# Patient Record
Sex: Female | Born: 1965 | Race: White | Hispanic: No | Marital: Married | Smoking: Never smoker
Health system: Southern US, Community
[De-identification: ages and names within clinical notes are randomized; demographics above are authoritative.]

## PROBLEM LIST (undated history)

## (undated) DIAGNOSIS — K219 Gastro-esophageal reflux disease without esophagitis: Secondary | ICD-10-CM

## (undated) DIAGNOSIS — R1310 Dysphagia, unspecified: Secondary | ICD-10-CM

## (undated) DIAGNOSIS — B019 Varicella without complication: Secondary | ICD-10-CM

## (undated) DIAGNOSIS — K589 Irritable bowel syndrome without diarrhea: Secondary | ICD-10-CM

## (undated) DIAGNOSIS — Z8719 Personal history of other diseases of the digestive system: Secondary | ICD-10-CM

## (undated) DIAGNOSIS — K635 Polyp of colon: Secondary | ICD-10-CM

## (undated) DIAGNOSIS — N809 Endometriosis, unspecified: Secondary | ICD-10-CM

## (undated) DIAGNOSIS — G43909 Migraine, unspecified, not intractable, without status migrainosus: Secondary | ICD-10-CM

## (undated) DIAGNOSIS — Z8711 Personal history of peptic ulcer disease: Secondary | ICD-10-CM

## (undated) DIAGNOSIS — Z87442 Personal history of urinary calculi: Secondary | ICD-10-CM

## (undated) DIAGNOSIS — F32A Depression, unspecified: Secondary | ICD-10-CM

## (undated) DIAGNOSIS — N281 Cyst of kidney, acquired: Secondary | ICD-10-CM

## (undated) DIAGNOSIS — F329 Major depressive disorder, single episode, unspecified: Secondary | ICD-10-CM

## (undated) HISTORY — DX: Personal history of urinary calculi: Z87.442

## (undated) HISTORY — DX: Endometriosis, unspecified: N80.9

## (undated) HISTORY — DX: Polyp of colon: K63.5

## (undated) HISTORY — DX: Varicella without complication: B01.9

## (undated) HISTORY — DX: Migraine, unspecified, not intractable, without status migrainosus: G43.909

## (undated) HISTORY — DX: Personal history of other diseases of the digestive system: Z87.19

## (undated) HISTORY — DX: Depression, unspecified: F32.A

## (undated) HISTORY — DX: Irritable bowel syndrome, unspecified: K58.9

## (undated) HISTORY — DX: Major depressive disorder, single episode, unspecified: F32.9

## (undated) HISTORY — DX: Dysphagia, unspecified: R13.10

---

## 1991-08-31 HISTORY — PX: LITHOTRIPSY: SUR834

## 1999-08-31 HISTORY — PX: ABDOMINAL HYSTERECTOMY: SHX81

## 2002-08-30 HISTORY — PX: BUNIONECTOMY: SHX129

## 2003-08-31 HISTORY — PX: BREAST SURGERY: SHX581

## 2013-08-30 DIAGNOSIS — K635 Polyp of colon: Secondary | ICD-10-CM

## 2013-08-30 HISTORY — DX: Polyp of colon: K63.5

## 2015-01-17 ENCOUNTER — Emergency Department
Admission: EM | Admit: 2015-01-17 | Discharge: 2015-01-17 | Disposition: A | Discharge status: Home / Self Care | Payer: BLUE CROSS/BLUE SHIELD | Attending: Emergency Medicine | Admitting: Emergency Medicine

## 2015-01-17 ENCOUNTER — Encounter: Payer: Self-pay | Admitting: Emergency Medicine

## 2015-01-17 ENCOUNTER — Other Ambulatory Visit: Payer: Self-pay

## 2015-01-17 DIAGNOSIS — R55 Syncope and collapse: Secondary | ICD-10-CM | POA: Diagnosis not present

## 2015-01-17 DIAGNOSIS — R109 Unspecified abdominal pain: Secondary | ICD-10-CM | POA: Insufficient documentation

## 2015-01-17 DIAGNOSIS — R1030 Lower abdominal pain, unspecified: Secondary | ICD-10-CM

## 2015-01-17 LAB — HEPATIC FUNCTION PANEL
ALT: 13 U/L — ABNORMAL LOW (ref 14–54)
AST: 18 U/L (ref 15–41)
Albumin: 4.4 g/dL (ref 3.5–5.0)
Alkaline Phosphatase: 79 U/L (ref 38–126)
Bilirubin, Direct: <0.1 mg/dL — ABNORMAL LOW (ref 0.1–0.5)
Total Bilirubin: 0.4 mg/dL (ref 0.3–1.2)
Total Protein: 7.9 g/dL (ref 6.5–8.1)

## 2015-01-17 LAB — URINALYSIS COMPLETE WITH MICROSCOPIC (ARMC ONLY)
Bacteria, UA: NONE SEEN
Bilirubin Urine: NEGATIVE
GLUCOSE, UA: NEGATIVE mg/dL
Ketones, ur: NEGATIVE mg/dL
Leukocytes, UA: NEGATIVE
NITRITE: NEGATIVE
Protein, ur: NEGATIVE mg/dL
Specific Gravity, Urine: 1.019 (ref 1.005–1.030)
pH: 6 (ref 5.0–8.0)

## 2015-01-17 LAB — BASIC METABOLIC PANEL
ANION GAP: 7 (ref 5–15)
BUN: 17 mg/dL (ref 6–20)
CO2: 28 mmol/L (ref 22–32)
Calcium: 9.6 mg/dL (ref 8.9–10.3)
Chloride: 104 mmol/L (ref 101–111)
Creatinine, Ser: 0.98 mg/dL (ref 0.44–1.00)
GFR calc Af Amer: >60 mL/min (ref >60)
GFR calc non Af Amer: >60 mL/min (ref >60)
Glucose, Bld: 92 mg/dL (ref 65–99)
POTASSIUM: 3.8 mmol/L (ref 3.5–5.1)
SODIUM: 139 mmol/L (ref 135–145)

## 2015-01-17 LAB — CBC
HEMATOCRIT: 44.8 % (ref 35.0–47.0)
Hemoglobin: 14.7 g/dL (ref 12.0–16.0)
MCH: 28.6 pg (ref 26.0–34.0)
MCHC: 32.9 g/dL (ref 32.0–36.0)
MCV: 86.9 fL (ref 80.0–100.0)
PLATELETS: 229 10*3/uL (ref 150–440)
RBC: 5.16 MIL/uL (ref 3.80–5.20)
RDW: 13.3 % (ref 11.5–14.5)
WBC: 8.4 10*3/uL (ref 3.6–11.0)

## 2015-01-17 LAB — LIPASE, BLOOD: LIPASE: 57 U/L — AB (ref 22–51)

## 2015-01-17 LAB — TROPONIN I: Troponin I: <0.03 ng/mL (ref <0.031)

## 2015-01-17 NOTE — ED Notes (Signed)
Pt from home c/o LLQ pain with some nausea and vomiting, diarrhea. States no emesis in last 24 hours. Pt alert & oriented with warm, dry skin. Dr. Reita Cliche in with pt at time of assessment and preparing pt for discharge.

## 2015-01-17 NOTE — ED Provider Notes (Signed)
[facility] Emergency Department Provider Note   ____________________________________________  Time seen: 1:30 PM I have reviewed the triage vital signs and the triage nursing note.  HISTORY  Chief Complaint Abdominal Pain and Loss of Consciousness   Historian Patient and her boyfriend  HPI Suzanne Cruz is a 49 y.o. female who came in for emergency department evaluation after passing out. She was having some abdominal cramping located diffusely and similar to prior "stomach" problems and started to feel lightheaded and dizzy and then passed out. She has no traumatic injury. She's had stomach ulcers and diverticulosis in the past. She is new to the area and does not have primary care or gastroenterologist here. She's not had a fever. She denies any chest pain, palpitations, short of breath, weakness, numbness.    Past Medical History  Diagnosis Date  . Esophagitis    stomach ulcers, diverticulitis  There are no active problems to display for this patient.   History reviewed. No pertinent past surgical history.  No current outpatient prescriptions on file.  Allergies Sulfa antibiotics  History reviewed. No pertinent family history.  Social History History  Substance Use Topics  . Smoking status: Never Smoker   . Smokeless tobacco: Never Used  . Alcohol Use: No   patient recently moved here from [facility]  Review of Systems  Constitutional: Negative for fever. Eyes: Negative for visual changes. ENT: Negative for sore throat. Cardiovascular: Negative for chest pain. Respiratory: Negative for shortness of breath. Gastrointestinal: Chronic abdominal pain in the epigastrium through to the back and the upper area. Diarrhea once last week and then resolved Genitourinary: Negative for dysuria. Musculoskeletal: Negative for back pain. Skin: Negative for rash. Neurological: Negative for headaches, focal weakness or  numbness.  ____________________________________________   PHYSICAL EXAM:  VITAL SIGNS: ED Triage Vitals  Enc Vitals Group     BP 01/17/15 1133 73/54 mmHg     Pulse Rate 01/17/15 1133 74     Resp 01/17/15 1133 20     Temp 01/17/15 1133 98 F (36.7 C)     Temp Source 01/17/15 1133 Oral     SpO2 01/17/15 1133 100 %     Weight 01/17/15 1133 125 lb (56.7 kg)     Height 01/17/15 1133 5\' 6"  (1.676 m)     Head Cir --      Peak Flow --      Pain Score 01/17/15 1134 5     Pain Loc --      Pain Edu? --      Excl. in [place]? --      Constitutional: Alert and oriented. Well appearing and in no distress. Eyes: Conjunctivae are normal. PERRL. Normal extraocular movements. ENT   Head: Normocephalic and atraumatic.   Nose: No congestion/rhinnorhea.   Mouth/Throat: Mucous membranes are moist.   Neck: No stridor. Cardiovascular: Normal rate, regular rhythm.  No murmurs, rubs, or gallops. Respiratory: Normal respiratory effort without tachypnea nor retractions. Breath sounds are clear and equal bilaterally. No wheezes/rales/rhonchi. Gastrointestinal: Soft with mild tenderness in the epigastrium but no focal right upper quadrant R to pain. No lower abdominal pain. No guarding no rebound. No distention. Genitourinary: Musculoskeletal: Nontender with normal range of motion in all extremities. No joint effusions.  No lower extremity tenderness nor edema. Neurologic:  Normal speech and language. No gross focal neurologic deficits are appreciated. Skin:  Skin is warm, dry and intact. No rash noted. Psychiatric: Mood and affect are normal. Speech and behavior are normal.  Patient exhibits appropriate insight and judgment.  ____________________________________________   EKG  I, Lisa Roca, MD, the attending physician have personally viewed and interpreted this ECG.  79 bpm. Normal sinus rhythm. Narrow QRS. Normal axis. Nonspecific ST-T  wave. ____________________________________________  LABS (pertinent positives/negatives)  CBC unremarkable with white blood count 8.4, hemoglobin at 14.7  Medical panel within normal limits  ____________________________________________  RADIOLOGY Radiologist results reviewed   __________________________________________  PROCEDURES  Procedure(s) performed: None Critical Care performed: None  ____________________________________________   ED COURSE / ASSESSMENT AND PLAN  Pertinent labs & imaging results that were available during my care of the patient were reviewed by me and considered in my medical decision making (see chart for details).  The patient is here for evaluation regarding the passing out episode which I do feel like was a vasovagal episode in the setting of abdominal pain. With regard to the abdominal pain, patient would not sought emergency department evaluation for the pain as it was very similar to her chronic abdominal pain episodes. She is requesting referral or contact information for primary care physician and a gastrologist in the area as she is new to the area. I discussed with her with her abdominal exam and laboratory evaluation being reassuring that I did not recommend any additional imaging at this point in time. She is comfortable with this approach.   I discussed with the patient added on lipase and hepatic function panel given the upper abdominal pain, however they do not want to stay for the results. I will call them with the added on blood test results.  Patient / Family / Caregiver informed of clinical course, understand medical decision-making process, and agree with plan.  ----------------------------------------- 3:14 PM on 01/17/2015 -----------------------------------------  I called the patient to let her know the lipase value of 57, and within normal limits hepatic function panel. We discussed return to emergency department for any new or  worsening pain or fever, or vomiting. She is instructed to go on to a liquid diet for 24 hours and make a follow-up appointment on Monday.  ___________________________________________   FINAL CLINICAL IMPRESSION(S) / ED DIAGNOSES   Final diagnoses:  Syncope, vasovagal  Abdominal pain of unknown cause, unspecified laterality   mild acute pancreatitis    Lisa Roca, MD 01/17/15 1515

## 2015-01-17 NOTE — ED Notes (Signed)
Pt discharged home after verbalizing understanding of discharge instructions; nad noted. 

## 2015-01-17 NOTE — Discharge Instructions (Signed)
You were evaluated after passing out during episode of abdominal pain, and your exam and evaluation are reassuring. We discussed return to the emergency department for fever, nor worsening abdominal pain, vomiting blood, black or bloody stools, chest pain, palpitations, shortness of breath, or additional passing out. Drink fluids. Follow-up numbers were provided to the clinic clinic for primary care, and after [facility] for gastro enterology.  Abdominal Pain Many things can cause abdominal pain. Usually, abdominal pain is not caused by a disease and will improve without treatment. It can often be observed and treated at home. Your health care provider will do a physical exam and possibly order blood tests and X-rays to help determine the seriousness of your pain. However, in many cases, more time must pass before a clear cause of the pain can be found. Before that point, your health care provider may not know if you need more testing or further treatment. HOME CARE INSTRUCTIONS  Monitor your abdominal pain for any changes. The following actions may help to alleviate any discomfort you are experiencing:  Only take over-the-counter or prescription medicines as directed by your health care provider.  Do not take laxatives unless directed to do so by your health care provider.  Try a clear liquid diet (broth, tea, or water) as directed by your health care provider. Slowly move to a bland diet as tolerated. SEEK MEDICAL CARE IF:  You have unexplained abdominal pain.  You have abdominal pain associated with nausea or diarrhea.  You have pain when you urinate or have a bowel movement.  You experience abdominal pain that wakes you in the night.  You have abdominal pain that is worsened or improved by eating food.  You have abdominal pain that is worsened with eating fatty foods.  You have a fever. SEEK IMMEDIATE MEDICAL CARE IF:   Your pain does not go away within 2 hours.  You keep throwing up  (vomiting).  Your pain is felt only in portions of the abdomen, such as the right side or the left lower portion of the abdomen.  You pass bloody or black tarry stools. MAKE SURE YOU:  Understand these instructions.   Will watch your condition.   Will get help right away if you are not doing well or get worse.  Document Released: 05/26/2005 Document Revised: 08/21/2013 Document Reviewed: 04/25/2013 [facility] Patient Information 2015 [place], [place]. This information is not intended to replace advice given to you by your health care provider. Make sure you discuss any questions you have with your health care provider.  Syncope Syncope is a medical term for fainting or passing out. This means you lose consciousness and drop to the ground. People are generally unconscious for less than 5 minutes. You may have some muscle twitches for up to 15 seconds before waking up and returning to normal. Syncope occurs more often in older adults, but it can happen to anyone. While most causes of syncope are not dangerous, syncope can be a sign of a serious medical problem. It is important to seek medical care.  CAUSES  Syncope is caused by a sudden drop in blood flow to the brain. The specific cause is often not determined. Factors that can bring on syncope include:  Taking medicines that lower blood pressure.  Sudden changes in posture, such as standing up quickly.  Taking more medicine than prescribed.  Standing in one place for too long.  Seizure disorders.  Dehydration and excessive exposure to heat.  Low blood sugar (hypoglycemia).  Straining to have a bowel movement.  Heart disease, irregular heartbeat, or other circulatory problems.  Fear, emotional distress, seeing blood, or severe pain. SYMPTOMS  Right before fainting, you may:  Feel dizzy or light-headed.  Feel nauseous.  See all white or all black in your field of vision.  Have cold, clammy skin. DIAGNOSIS  Your health  care provider will ask about your symptoms, perform a physical exam, and perform an electrocardiogram (ECG) to record the electrical activity of your heart. Your health care provider may also perform other heart or blood tests to determine the cause of your syncope which may include:  Transthoracic echocardiogram (TTE). During echocardiography, sound waves are used to evaluate how blood flows through your heart.  Transesophageal echocardiogram (TEE).  Cardiac monitoring. This allows your health care provider to monitor your heart rate and rhythm in real time.  Holter monitor. This is a portable device that records your heartbeat and can help diagnose heart arrhythmias. It allows your health care provider to track your heart activity for several days, if needed.  Stress tests by exercise or by giving medicine that makes the heart beat faster. TREATMENT  In most cases, no treatment is needed. Depending on the cause of your syncope, your health care provider may recommend changing or stopping some of your medicines. HOME CARE INSTRUCTIONS  Have someone stay with you until you feel stable.  Do not drive, use machinery, or play sports until your health care provider says it is okay.  Keep all follow-up appointments as directed by your health care provider.  Lie down right away if you start feeling like you might faint. Breathe deeply and steadily. Wait until all the symptoms have passed.  Drink enough fluids to keep your urine clear or pale yellow.  If you are taking blood pressure or heart medicine, get up slowly and take several minutes to sit and then stand. This can reduce dizziness. SEEK IMMEDIATE MEDICAL CARE IF:   You have a severe headache.  You have unusual pain in the chest, abdomen, or back.  You are bleeding from your mouth or rectum, or you have black or tarry stool.  You have an irregular or very fast heartbeat.  You have pain with breathing.  You have repeated fainting  or seizure-like jerking during an episode.  You faint when sitting or lying down.  You have confusion.  You have trouble walking.  You have severe weakness.  You have vision problems. If you fainted, call your local emergency services (911 in [place] Do not drive yourself to the hospital.  MAKE SURE YOU:  Understand these instructions.  Will watch your condition.  Will get help right away if you are not doing well or get worse. Document Released: 08/16/2005 Document Revised: 08/21/2013 Document Reviewed: 10/15/2011 [facility] Patient Information 2015 [place], [place]. This information is not intended to replace advice given to you by your health care provider. Make sure you discuss any questions you have with your health care provider.

## 2015-01-17 NOTE — ED Notes (Signed)
Patient to ED with c/o experiencing lower abdominal pain earlier in day. Patient went to [facility] and had BM at which time she became diaphoretic and had syncopal episode. Reports waking up on floor. Patient reports now having LUQ abdominal pain radiating into back.

## 2015-02-03 DIAGNOSIS — K219 Gastro-esophageal reflux disease without esophagitis: Secondary | ICD-10-CM | POA: Insufficient documentation

## 2015-02-03 DIAGNOSIS — R1310 Dysphagia, unspecified: Secondary | ICD-10-CM | POA: Insufficient documentation

## 2015-02-07 ENCOUNTER — Encounter: Payer: Self-pay | Admitting: *Deleted

## 2015-02-10 ENCOUNTER — Ambulatory Visit: Payer: BLUE CROSS/BLUE SHIELD | Admitting: Anesthesiology

## 2015-02-10 ENCOUNTER — Ambulatory Visit
Admission: RE | Admit: 2015-02-10 | Discharge: 2015-02-10 | Disposition: A | Discharge status: Home / Self Care | Payer: BLUE CROSS/BLUE SHIELD | Source: Ambulatory Visit | Attending: Unknown Physician Specialty | Admitting: Unknown Physician Specialty

## 2015-02-10 ENCOUNTER — Encounter: Payer: Self-pay | Admitting: *Deleted

## 2015-02-10 ENCOUNTER — Encounter
Admission: RE | Disposition: A | Discharge status: Home / Self Care | Payer: Self-pay | Source: Ambulatory Visit | Attending: Unknown Physician Specialty

## 2015-02-10 DIAGNOSIS — R109 Unspecified abdominal pain: Secondary | ICD-10-CM | POA: Diagnosis not present

## 2015-02-10 DIAGNOSIS — Z882 Allergy status to sulfonamides status: Secondary | ICD-10-CM | POA: Insufficient documentation

## 2015-02-10 DIAGNOSIS — K219 Gastro-esophageal reflux disease without esophagitis: Secondary | ICD-10-CM | POA: Insufficient documentation

## 2015-02-10 DIAGNOSIS — K2970 Gastritis, unspecified, without bleeding: Secondary | ICD-10-CM | POA: Diagnosis not present

## 2015-02-10 DIAGNOSIS — R1310 Dysphagia, unspecified: Secondary | ICD-10-CM | POA: Diagnosis present

## 2015-02-10 DIAGNOSIS — K319 Disease of stomach and duodenum, unspecified: Secondary | ICD-10-CM | POA: Diagnosis not present

## 2015-02-10 DIAGNOSIS — Z87442 Personal history of urinary calculi: Secondary | ICD-10-CM | POA: Insufficient documentation

## 2015-02-10 DIAGNOSIS — K222 Esophageal obstruction: Secondary | ICD-10-CM | POA: Insufficient documentation

## 2015-02-10 DIAGNOSIS — R12 Heartburn: Secondary | ICD-10-CM | POA: Insufficient documentation

## 2015-02-10 DIAGNOSIS — Z8371 Family history of colonic polyps: Secondary | ICD-10-CM | POA: Diagnosis not present

## 2015-02-10 DIAGNOSIS — G8929 Other chronic pain: Secondary | ICD-10-CM | POA: Insufficient documentation

## 2015-02-10 DIAGNOSIS — Z79899 Other long term (current) drug therapy: Secondary | ICD-10-CM | POA: Diagnosis not present

## 2015-02-10 DIAGNOSIS — Z86010 Personal history of colonic polyps: Secondary | ICD-10-CM | POA: Insufficient documentation

## 2015-02-10 HISTORY — PX: ESOPHAGOGASTRODUODENOSCOPY (EGD) WITH PROPOFOL: SHX5813

## 2015-02-10 HISTORY — DX: Personal history of peptic ulcer disease: Z87.11

## 2015-02-10 HISTORY — DX: Personal history of other diseases of the digestive system: Z87.19

## 2015-02-10 HISTORY — DX: Gastro-esophageal reflux disease without esophagitis: K21.9

## 2015-02-10 SURGERY — ESOPHAGOGASTRODUODENOSCOPY (EGD) WITH PROPOFOL
Anesthesia: General

## 2015-02-10 MED ORDER — LIDOCAINE HCL (CARDIAC) 20 MG/ML IV SOLN
INTRAVENOUS | Status: DC | PRN
  Administered 2015-02-10: 100 mg via INTRAVENOUS

## 2015-02-10 MED ORDER — FENTANYL CITRATE (PF) 100 MCG/2ML IJ SOLN
INTRAMUSCULAR | Status: DC | PRN
  Administered 2015-02-10: 50 ug via INTRAVENOUS

## 2015-02-10 MED ORDER — SODIUM CHLORIDE 0.9 % IV SOLN
INTRAVENOUS | Status: DC
  Administered 2015-02-10 (×2): via INTRAVENOUS

## 2015-02-10 MED ORDER — MIDAZOLAM HCL 5 MG/5ML IJ SOLN
INTRAMUSCULAR | Status: DC | PRN
  Administered 2015-02-10: 2 mg via INTRAVENOUS

## 2015-02-10 MED ORDER — PROPOFOL 10 MG/ML IV BOLUS
INTRAVENOUS | Status: DC | PRN
  Administered 2015-02-10: 100 mg via INTRAVENOUS
  Administered 2015-02-10: 50 mg via INTRAVENOUS

## 2015-02-10 NOTE — Transfer of Care (Signed)
Immediate Anesthesia Transfer of Care Note  Patient: Suzanne Cruz  Procedure(s) Performed: Procedure(s): ESOPHAGOGASTRODUODENOSCOPY (EGD) WITH PROPOFOL (N/A)  Patient Location: PACU  Anesthesia Type:General  Level of Consciousness: sedated and patient cooperative  Airway & Oxygen Therapy: Patient Spontanous Breathing and Patient connected to nasal cannula oxygen  Post-op Assessment: Report given to RN and Post -op Vital signs reviewed and stable  Post vital signs: Reviewed and stable  Last Vitals:  Filed Vitals:   02/10/15 1442  BP: 96/57  Pulse: 61  Temp: 36.1 C  Resp: 15    Complications: No apparent anesthesia complications

## 2015-02-10 NOTE — Op Note (Signed)
[facility] Patient Name: Suzanne Cruz Procedure Date: 02/10/2015 2:01 PM MRN: [id number] Account #: [id number] Date of Birth: 1965/12/18 Admit Type: Outpatient Age: 49 Room: [facility] ENDO ROOM 1 Gender: Female Note Status: Finalized Procedure:         Upper GI endoscopy Indications:       Dysphagia, Heartburn Providers:         [name], MD Referring MD:      No Local Md, MD (Referring MD) Medicines:         Propofol per Anesthesia Complications:     No immediate complications. Procedure:         Pre-Anesthesia Assessment:                    - After reviewing the risks and benefits, the patient was                     deemed in satisfactory condition to undergo the procedure.                    After obtaining informed consent, the endoscope was passed                     under direct vision. Throughout the procedure, the                     patient's blood pressure, pulse, and oxygen saturations                     were monitored continuously. The Endoscope was introduced                     through the mouth, and advanced to the second part of                     duodenum. The upper GI endoscopy was accomplished without                     difficulty. The patient tolerated the procedure well. Findings:      A mild Schatzki ring (acquired) was found at the gastroesophageal       junction. A the end of the procedure A guidewire was placed and the       scope was withdrawn. Dilation was performed with a Savary dilator with       mild resistance at 15 mm, 16 mm and 17 mm. Biopsies were taken with a       cold forceps for histology. Before the dilatation biopsies were taken       with a cold forceps for histology from the antrum.      Diffuse mildly erythematous mucosa without bleeding was found in the       gastric antrum. Biopsies were taken with a cold forceps for histology.       Biopsies were taken with a cold forceps for Helicobacter  pylori testing.      The examined duodenum was normal.      A very small hiatus hernia was present. Impression:        - Mild Schatzki ring. Dilated. Biopsied.                    - Erythematous mucosa in the antrum. Biopsied.                    -  Normal examined duodenum. Recommendation:    - Await pathology results.                    - soft food for 3 days, eat slowly, chew well, take small                     bites. Contiinue medication.[name], MD 02/10/2015 2:22:34 PM This report has been signed electronically. Number of Addenda: 0 Note Initiated On: 02/10/2015 2:01 PM      [facility]

## 2015-02-10 NOTE — Anesthesia Preprocedure Evaluation (Signed)
Anesthesia Evaluation  Patient identified by MRN, date of birth, ID band Patient awake    Reviewed: Allergy & Precautions, H&P , NPO status , Patient's Chart, lab work & pertinent test results, reviewed documented beta blocker date and time   Airway Mallampati: II  TM Distance: >3 FB Neck ROM: full    Dental no notable dental hx.    Pulmonary neg pulmonary ROS,  breath sounds clear to auscultation  Pulmonary exam normal       Cardiovascular Exercise Tolerance: Good negative cardio ROS  Rhythm:regular Rate:Normal     Neuro/Psych negative neurological ROS  negative psych ROS   GI/Hepatic negative GI ROS, Neg liver ROS, GERD-  ,  Endo/Other  negative endocrine ROS  Renal/GU negative Renal ROS  negative genitourinary   Musculoskeletal   Abdominal   Peds  Hematology negative hematology ROS (+)   Anesthesia Other Findings   Reproductive/Obstetrics negative OB ROS                             Anesthesia Physical Anesthesia Plan  ASA: II  Anesthesia Plan: General   Post-op Pain Management:    Induction:   Airway Management Planned:   Additional Equipment:   Intra-op Plan:   Post-operative Plan:   Informed Consent: I have reviewed the patients History and Physical, chart, labs and discussed the procedure including the risks, benefits and alternatives for the proposed anesthesia with the patient or authorized representative who has indicated his/her understanding and acceptance.   Dental Advisory Given  Plan Discussed with: CRNA  Anesthesia Plan Comments:         Anesthesia Quick Evaluation

## 2015-02-10 NOTE — Addendum Note (Signed)
Addendum  created 02/10/15 1505 by Elyse Hsu, MD   Modules edited: Notes Section   Notes Section:  Delete: [id number]

## 2015-02-10 NOTE — Anesthesia Postprocedure Evaluation (Signed)
  Anesthesia Post-op Note  Patient: Development worker, community  Procedure(s) Performed: Procedure(s): ESOPHAGOGASTRODUODENOSCOPY (EGD) WITH PROPOFOL (N/A)  Anesthesia type:General  Patient location: PACU  Post pain: Pain level controlled  Post assessment: Post-op Vital signs reviewed, Patient's Cardiovascular Status Stable, Respiratory Function Stable, Patent Airway and No signs of Nausea or vomiting  Post vital signs: Reviewed and stable  Last Vitals:  Filed Vitals:   02/10/15 1303  BP: 105/58  Pulse: 60  Temp: 36.5 C  Resp: 16    Level of consciousness: awake, alert  and patient cooperative  Complications: No apparent anesthesia complications

## 2015-02-12 ENCOUNTER — Encounter: Payer: Self-pay | Admitting: Unknown Physician Specialty

## 2015-02-12 LAB — SURGICAL PATHOLOGY

## 2015-02-14 ENCOUNTER — Encounter: Payer: Self-pay | Admitting: Unknown Physician Specialty

## 2015-02-14 NOTE — Addendum Note (Signed)
Addendum  created 02/14/15 1236 by Allean Found, CRNA   Modules edited: Anesthesia Attestations

## 2015-02-14 NOTE — Addendum Note (Signed)
Addendum  created 02/14/15 1513 by Elyse Hsu, MD   Modules edited: Anesthesia Attestations, Anesthesia Events, Narrator   Narrator:  Narrator: Event Log Edited

## 2015-08-15 LAB — BASIC METABOLIC PANEL
BUN: 11 mg/dL (ref 4–21)
Creatinine: 0.9 mg/dL (ref 0.5–1.1)
Glucose: 82 mg/dL
POTASSIUM: 4.3 mmol/L (ref 3.4–5.3)
SODIUM: 141 mmol/L (ref 137–147)

## 2015-08-15 LAB — CBC AND DIFFERENTIAL
HEMATOCRIT: 41 % (ref 36–46)
HEMOGLOBIN: 13.6 g/dL (ref 12.0–16.0)
Neutrophils Absolute: 4 /uL
Platelets: 236 10*3/uL (ref 150–399)
WBC: 5.9 10*3/mL

## 2015-08-15 LAB — HEMOGLOBIN A1C: HEMOGLOBIN A1C: 5.8

## 2015-08-15 LAB — HEPATIC FUNCTION PANEL
AST: 15 U/L (ref 13–35)
Alkaline Phosphatase: 85 U/L (ref 25–125)
BILIRUBIN, TOTAL: 0.3 mg/dL

## 2015-09-17 ENCOUNTER — Ambulatory Visit (INDEPENDENT_AMBULATORY_CARE_PROVIDER_SITE_OTHER): Payer: BLUE CROSS/BLUE SHIELD | Admitting: Family Medicine

## 2015-09-17 ENCOUNTER — Encounter: Payer: Self-pay | Admitting: Family Medicine

## 2015-09-17 ENCOUNTER — Encounter (INDEPENDENT_AMBULATORY_CARE_PROVIDER_SITE_OTHER): Payer: Self-pay

## 2015-09-17 VITALS — BP 122/64 | HR 91 | Temp 98.4°F | Ht 65.5 in | Wt 130.0 lb

## 2015-09-17 DIAGNOSIS — Z23 Encounter for immunization: Secondary | ICD-10-CM | POA: Diagnosis not present

## 2015-09-17 DIAGNOSIS — Z13220 Encounter for screening for lipoid disorders: Secondary | ICD-10-CM

## 2015-09-17 DIAGNOSIS — K219 Gastro-esophageal reflux disease without esophagitis: Secondary | ICD-10-CM

## 2015-09-17 DIAGNOSIS — Z8719 Personal history of other diseases of the digestive system: Secondary | ICD-10-CM | POA: Insufficient documentation

## 2015-09-17 DIAGNOSIS — F900 Attention-deficit hyperactivity disorder, predominantly inattentive type: Secondary | ICD-10-CM

## 2015-09-17 DIAGNOSIS — F909 Attention-deficit hyperactivity disorder, unspecified type: Secondary | ICD-10-CM

## 2015-09-17 DIAGNOSIS — Z0000 Encounter for general adult medical examination without abnormal findings: Secondary | ICD-10-CM | POA: Diagnosis not present

## 2015-09-17 HISTORY — DX: Personal history of other diseases of the digestive system: Z87.19

## 2015-09-17 LAB — LIPID PANEL
Cholesterol: 229 mg/dL — ABNORMAL HIGH (ref 0–200)
HDL: 57.8 mg/dL (ref >39.00)
NONHDL: 171.08
TRIGLYCERIDES: 282 mg/dL — AB (ref 0.0–149.0)
Total CHOL/HDL Ratio: 4
VLDL: 56.4 mg/dL — AB (ref 0.0–40.0)

## 2015-09-17 LAB — LDL CHOLESTEROL, DIRECT: Direct LDL: 125 mg/dL

## 2015-09-17 MED ORDER — ADDERALL XR 30 MG PO CP24: 30.0000 mg | ORAL_CAPSULE | Freq: Every day | ORAL | Status: DC

## 2015-09-17 NOTE — Progress Notes (Signed)
Pre visit review using our clinic review tool, if applicable. No additional management support is needed unless otherwise documented below in the visit note. 

## 2015-09-17 NOTE — Patient Instructions (Signed)
It was nice to see you today.  Be sure to follow up closely.  Continue your current medications.  Please let me know if you would like me to set up a mammogram.  I recommend [facility] in [place] for your Gyn care.  Discuss the pellets with them.  Take care  Dr. Lacinda Axon   Health Maintenance, Female Adopting a healthy lifestyle and getting preventive care can go a long way to promote health and wellness. Talk with your health care provider about what schedule of regular examinations is right for you. This is a good chance for you to check in with your provider about disease prevention and staying healthy. In between checkups, there are plenty of things you can do on your own. Experts have done a lot of research about which lifestyle changes and preventive measures are most likely to keep you healthy. Ask your health care provider for more information. WEIGHT AND DIET  Eat a healthy diet  Be sure to include plenty of vegetables, fruits, low-fat dairy products, and lean protein.  Do not eat a lot of foods high in solid fats, added sugars, or salt.  Get regular exercise. This is one of the most important things you can do for your health.  Most adults should exercise for at least 150 minutes each week. The exercise should increase your heart rate and make you sweat (moderate-intensity exercise).  Most adults should also do strengthening exercises at least twice a week. This is in addition to the moderate-intensity exercise.  Maintain a healthy weight  Body mass index (BMI) is a measurement that can be used to identify possible weight problems. It estimates body fat based on height and weight. Your health care provider can help determine your BMI and help you achieve or maintain a healthy weight.  For females 49 years of age and older:   A BMI below 18.5 is considered underweight.  A BMI of 18.5 to 24.9 is normal.  A BMI of 25 to 29.9 is considered overweight.  A BMI  of 30 and above is considered obese.  Watch levels of cholesterol and blood lipids  You should start having your blood tested for lipids and cholesterol at 48 years of age, then have this test every 5 years.  You may need to have your cholesterol levels checked more often if:  Your lipid or cholesterol levels are high.  You are older than 48 years of age.  You are at high risk for heart disease.  CANCER SCREENING   Lung Cancer  Lung cancer screening is recommended for adults 30-2 years old who are at high risk for lung cancer because of a history of smoking.  A yearly low-dose CT scan of the lungs is recommended for people who:  Currently smoke.  Have quit within the past 15 years.  Have at least a 30-pack-year history of smoking. A pack year is smoking an average of one pack of cigarettes a day for 1 year.  Yearly screening should continue until it has been 15 years since you quit.  Yearly screening should stop if you develop a health problem that would prevent you from having lung cancer treatment.  Breast Cancer  Practice breast self-awareness. This means understanding how your breasts normally appear and feel.  It also means doing regular breast self-exams. Let your health care provider know about any changes, no matter how small.  If you are in your 20s or 30s, you should have a clinical breast  exam (CBE) by a health care provider every 1-3 years as part of a regular health exam.  If you are 24 or older, have a CBE every year. Also consider having a breast X-ray (mammogram) every year.  If you have a family history of breast cancer, talk to your health care provider about genetic screening.  If you are at high risk for breast cancer, talk to your health care provider about having an MRI and a mammogram every year.  Breast cancer gene (BRCA) assessment is recommended for women who have family members with BRCA-related cancers. BRCA-related cancers  include:  Breast.  Ovarian.  Tubal.  Peritoneal cancers.  Results of the assessment will determine the need for genetic counseling and BRCA1 and BRCA2 testing. Cervical Cancer Your health care provider may recommend that you be screened regularly for cancer of the pelvic organs (ovaries, uterus, and vagina). This screening involves a pelvic examination, including checking for microscopic changes to the surface of your cervix (Pap test). You may be encouraged to have this screening done every 3 years, beginning at age 78.  For women ages 68-65, health care providers may recommend pelvic exams and Pap testing every 3 years, or they may recommend the Pap and pelvic exam, combined with testing for human papilloma virus (HPV), every 5 years. Some types of HPV increase your risk of cervical cancer. Testing for HPV may also be done on women of any age with unclear Pap test results.  Other health care providers may not recommend any screening for nonpregnant women who are considered low risk for pelvic cancer and who do not have symptoms. Ask your health care provider if a screening pelvic exam is right for you.  If you have had past treatment for cervical cancer or a condition that could lead to cancer, you need Pap tests and screening for cancer for at least 20 years after your treatment. If Pap tests have been discontinued, your risk factors (such as having a new sexual partner) need to be reassessed to determine if screening should resume. Some women have medical problems that increase the chance of getting cervical cancer. In these cases, your health care provider may recommend more frequent screening and Pap tests. Colorectal Cancer  This type of cancer can be detected and often prevented.  Routine colorectal cancer screening usually begins at 48 years of age and continues through 48 years of age.  Your health care provider may recommend screening at an earlier age if you have risk factors for  colon cancer.  Your health care provider may also recommend using home test kits to check for hidden blood in the stool.  A small camera at the end of a tube can be used to examine your colon directly (sigmoidoscopy or colonoscopy). This is done to check for the earliest forms of colorectal cancer.  Routine screening usually begins at age 55.  Direct examination of the colon should be repeated every 5-10 years through 48 years of age. However, you may need to be screened more often if early forms of precancerous polyps or small growths are found. Skin Cancer  Check your skin from head to toe regularly.  Tell your health care provider about any new moles or changes in moles, especially if there is a change in a mole's shape or color.  Also tell your health care provider if you have a mole that is larger than the size of a pencil eraser.  Always use sunscreen. Apply sunscreen liberally and repeatedly  throughout the day.  Protect yourself by wearing long sleeves, pants, a wide-brimmed hat, and sunglasses whenever you are outside. HEART DISEASE, DIABETES, AND HIGH BLOOD PRESSURE   High blood pressure causes heart disease and increases the risk of stroke. High blood pressure is more likely to develop in:  People who have blood pressure in the high end of the normal range (130-139/85-89 mm Hg).  People who are overweight or obese.  People who are African American.  If you are 61-37 years of age, have your blood pressure checked every 3-5 years. If you are 49 years of age or older, have your blood pressure checked every year. You should have your blood pressure measured twice--once when you are at a hospital or clinic, and once when you are not at a hospital or clinic. Record the average of the two measurements. To check your blood pressure when you are not at a hospital or clinic, you can use:  An automated blood pressure machine at a pharmacy.  A home blood pressure monitor.  If you  are between 68 years and 50 years old, ask your health care provider if you should take aspirin to prevent strokes.  Have regular diabetes screenings. This involves taking a blood sample to check your fasting blood sugar level.  If you are at a normal weight and have a low risk for diabetes, have this test once every three years after 48 years of age.  If you are overweight and have a high risk for diabetes, consider being tested at a younger age or more often. PREVENTING INFECTION  Hepatitis B  If you have a higher risk for hepatitis B, you should be screened for this virus. You are considered at high risk for hepatitis B if:  You were born in a country where hepatitis B is common. Ask your health care provider which countries are considered high risk.  Your parents were born in a high-risk country, and you have not been immunized against hepatitis B (hepatitis B vaccine).  You have HIV or AIDS.  You use needles to inject street drugs.  You live with someone who has hepatitis B.  You have had sex with someone who has hepatitis B.  You get hemodialysis treatment.  You take certain medicines for conditions, including cancer, organ transplantation, and autoimmune conditions. Hepatitis C  Blood testing is recommended for:  Everyone born from [birth year] through 1963.  Anyone with known risk factors for hepatitis C. Sexually transmitted infections (STIs)  You should be screened for sexually transmitted infections (STIs) including gonorrhea and chlamydia if:  You are sexually active and are younger than 48 years of age.  You are older than 48 years of age and your health care provider tells you that you are at risk for this type of infection.  Your sexual activity has changed since you were last screened and you are at an increased risk for chlamydia or gonorrhea. Ask your health care provider if you are at risk.  If you do not have HIV, but are at risk, it may be recommended that you  take a prescription medicine daily to prevent HIV infection. This is called pre-exposure prophylaxis (PrEP). You are considered at risk if:  You are sexually active and do not regularly use condoms or know the HIV status of your partner(s).  You take drugs by injection.  You are sexually active with a partner who has HIV. Talk with your health care provider about whether you are at high  risk of being infected with HIV. If you choose to begin PrEP, you should first be tested for HIV. You should then be tested every 3 months for as long as you are taking PrEP.  PREGNANCY   If you are premenopausal and you may become pregnant, ask your health care provider about preconception counseling.  If you may become pregnant, take 400 to 800 micrograms (mcg) of folic acid every day.  If you want to prevent pregnancy, talk to your health care provider about birth control (contraception). OSTEOPOROSIS AND MENOPAUSE   Osteoporosis is a disease in which the bones lose minerals and strength with aging. This can result in serious bone fractures. Your risk for osteoporosis can be identified using a bone density scan.  If you are 40 years of age or older, or if you are at risk for osteoporosis and fractures, ask your health care provider if you should be screened.  Ask your health care provider whether you should take a calcium or vitamin D supplement to lower your risk for osteoporosis.  Menopause may have certain physical symptoms and risks.  Hormone replacement therapy may reduce some of these symptoms and risks. Talk to your health care provider about whether hormone replacement therapy is right for you.  HOME CARE INSTRUCTIONS   Schedule regular health, dental, and eye exams.  Stay current with your immunizations.   Do not use any tobacco products including cigarettes, chewing tobacco, or electronic cigarettes.  If you are pregnant, do not drink alcohol.  If you are breastfeeding, limit how  much and how often you drink alcohol.  Limit alcohol intake to no more than 1 drink per day for nonpregnant women. One drink equals 12 ounces of beer, 5 ounces of wine, or 1 ounces of hard liquor.  Do not use street drugs.  Do not share needles.  Ask your health care provider for help if you need support or information about quitting drugs.  Tell your health care provider if you often feel depressed.  Tell your health care provider if you have ever been abused or do not feel safe at home.   This information is not intended to replace advice given to you by your health care provider. Make sure you discuss any questions you have with your health care provider.   Document Released: 03/01/2011 Document Revised: 09/06/2014 Document Reviewed: 07/18/2013 Elsevier Interactive Patient Education [employer].

## 2015-09-18 ENCOUNTER — Encounter: Payer: Self-pay | Admitting: Family Medicine

## 2015-09-18 ENCOUNTER — Encounter: Payer: Self-pay | Admitting: General Practice

## 2015-09-18 DIAGNOSIS — F900 Attention-deficit hyperactivity disorder, predominantly inattentive type: Secondary | ICD-10-CM | POA: Insufficient documentation

## 2015-09-18 DIAGNOSIS — F909 Attention-deficit hyperactivity disorder, unspecified type: Secondary | ICD-10-CM | POA: Insufficient documentation

## 2015-09-18 LAB — HEPATITIS B SURFACE ANTIBODY, QUANTITATIVE: HEPATITIS B-POST: 3.1 m[IU]/mL

## 2015-09-18 NOTE — Assessment & Plan Note (Signed)
Patient has a documented psychological assessment from 3. Assessment to be consistent with ADHD. She is requesting refill Adderall today. Rx refilled.

## 2015-09-18 NOTE — Assessment & Plan Note (Addendum)
Tdap given today. Declined influenza. Hep B surface antigen and TB gold today (for school). Screening lipid panel today. Holding off on mammogram given patient preference. Pap smear up-to-date. Patient is currently on hormonal replacement after seeing a physician in [place] who specializes in integrative medicine. This physician also appears to have sold her several supplements. I reviewed his note and informed her that the literature does not support use of these supplements. Regarding hormone replacement, I cautioned her about the use given the fact that hormone replacement is associated with breast cancer, stroke, DVT/PE. I advised her to seek guidance from a board certified OB/GYN.

## 2015-09-18 NOTE — Progress Notes (Signed)
Subjective:  Patient ID: Suzanne Cruz, female    DOB: Oct 22, 1965  Age: 50 y.o. MRN: [id number]  CC: Establish care, medication refill  HPI Suzanne Cruz is a 50 y.o. female presents to the clinic today to establish care.   Preventative Healthcare  Pap smear: up to date (last done in 2015).  Mammogram: Last done in 2015. Wants to hold on setting up for now.  Colonoscopy: Last done in 2016.   Immunizations  Tetanus - In need of.  Flu - Declines today.  Labs: Has had recent labs. Will need a lipid panel today. Additionally, she is going to school a TB test as well as a Hep B titer.   Exercise: Yes, regular exercise.   Alcohol use: See below.   Smoking/tobacco use: Nonsmoker.   Regular dental exams: Yes.   Wears seat belt: Yes.   PMH, Surgical Hx, Family Hx, Social History reviewed and updated as below.  Past Medical History  Diagnosis Date  . History of kidney stones   . History of stomach ulcers   . Dysphagia   . Chicken pox   . Colon polyps 2015  . Migraines   . Depression   . Fainting spell    Past Surgical History  Procedure Laterality Date  . Esophagogastroduodenoscopy (egd) with propofol N/A 02/10/2015    Procedure: ESOPHAGOGASTRODUODENOSCOPY (EGD) WITH PROPOFOL;  Surgeon: [name], MD;  Location: [facility];  Service: Endoscopy;  Laterality: N/A;  . Breast surgery Left 2005    biopsy  . Abdominal hysterectomy  2001    partial only right ovary is the only thing she as.  . Bunionectomy Left 2004  . Lithotripsy  1993   Family History  Problem Relation Age of Onset  . Sudden death Brother    Social History  Substance Use Topics  . Smoking status: Never Smoker   . Smokeless tobacco: Never Used  . Alcohol Use: 0.0 - 0.6 oz/week    0-1 Glasses of wine per week   Review of Systems  Gastrointestinal: Positive for abdominal pain.  Musculoskeletal: Positive for myalgias and arthralgias.  Neurological: Positive for syncope and headaches.   All other systems reviewed and are negative.  Objective:   Today's Vitals: BP 122/64 mmHg  Pulse 91  Temp(Src) 98.4 F (36.9 C) (Oral)  Ht 5' 5.5" (1.664 m)  Wt 130 lb (58.968 kg)  BMI 21.30 kg/m2  SpO2 97%  Physical Exam  Constitutional: She is oriented to person, place, and time. She appears well-developed and well-nourished. No distress.  HENT:  Head: Normocephalic and atraumatic.  Mouth/Throat: Oropharynx is clear and moist. No oropharyngeal exudate.  Normal TM's bilaterally.   Eyes: Conjunctivae are normal. No scleral icterus.  Neck: Neck supple.  Cardiovascular: Normal rate and regular rhythm.   Pulmonary/Chest: Effort normal and breath sounds normal. She has no wheezes. She has no rales.  Abdominal: Soft. She exhibits no distension. There is no tenderness. There is no rebound and no guarding.  Musculoskeletal: Normal range of motion. She exhibits no edema.  Neurological: She is alert and oriented to person, place, and time.  Skin: Skin is warm. No rash noted.  Psychiatric:  Flat affect.   Vitals reviewed.  Assessment & Plan:   Problem List Items Addressed This Visit    Preventative health care - Primary    Tdap given today. Declined influenza. Hep B surface antigen and TB gold today (for school). Screening lipid panel today. Holding off on mammogram given patient preference. Pap  smear up-to-date. Patient is currently on hormonal replacement after seeing a physician in [place] who specializes in integrative medicine. This physician also appears to have sold her several supplements. I reviewed his note and informed her that the literature does not support use of these supplements. Regarding hormone replacement, I cautioned her about the use given the fact that hormone replacement is associated with breast cancer, stroke, DVT/PE. I advised her to seek guidance from a board certified OB/GYN.      Relevant Orders   Hepatitis B surface antibody   Quantiferon tb  gold assay   GERD (gastroesophageal reflux disease)   Relevant Medications   ranitidine (ZANTAC) 150 MG tablet   DIGESTIVE ENZYMES PO   Adult ADHD    Patient has a documented psychological assessment from 55. Assessment to be consistent with ADHD. She is requesting refill Adderall today. Rx refilled.        Other Visit Diagnoses    Screening, lipid        Relevant Orders    Lipid panel (Completed)    Quantiferon tb gold assay    Need for Tdap vaccination        Relevant Orders    Tdap vaccine greater than or equal to 7yo IM (Completed)       Outpatient Encounter Prescriptions as of 09/17/2015  Medication Sig  . ADDERALL XR 30 MG 24 hr capsule Take 1 capsule (30 mg total) by mouth daily.  . cetirizine (ZYRTEC) 10 MG tablet Take 10 mg by mouth daily as needed for allergies.  . chlorhexidine (PERIDEX) 0.12 % solution RINSE MOUTH WITH 1/2 OZ (15 ML) TWICE A DAY FOR 30 SECONDS AND SPIT  . Cholecalciferol (VITAMIN D3) 10000 units TABS Take 10,000 Units by mouth.  . cyanocobalamin 2000 MCG tablet Take 2,000 mcg by mouth daily.  Marland Kitchen DIGESTIVE ENZYMES PO Take by mouth.  . lidocaine-prilocaine (EMLA) cream Apply 1 application topically at bedtime. Reported on 09/17/2015  . lubiprostone (AMITIZA) 8 MCG capsule Take 8 mcg by mouth 2 (two) times daily as needed for constipation.   . Omega-3 Fatty Acids (OMEGA 3 PO) Take by mouth 2 (two) times daily.  Marland Kitchen omeprazole (PRILOSEC) 40 MG capsule Take 40 mg by mouth once.   . Probiotic Product (PROBIOTIC DAILY PO) Take 1 tablet by mouth daily.  . ranitidine (ZANTAC) 150 MG tablet Take 150 mg by mouth 2 (two) times daily.  . sucralfate (CARAFATE) 1 G tablet Take 1 g by mouth 2 (two) times daily.   . tizanidine (ZANAFLEX) 6 MG capsule Take 6 mg by mouth at bedtime as needed for muscle spasms.  Marland Kitchen VITAMIN K PO Take by mouth once.  . [DISCONTINUED] ADDERALL XR 30 MG 24 hr capsule TK ONE C PO D  . [DISCONTINUED] amphetamine-dextroamphetamine (ADDERALL) 20  MG tablet Take 20 mg by mouth every other day.   . [DISCONTINUED] FLUoxetine (PROZAC) 40 MG capsule Take 40 mg by mouth daily.  . [DISCONTINUED] progesterone (PROMETRIUM) 100 MG capsule TAKE 1 TO 2 CAPSULES BY MOUTH EVERY NIGHT  . methylcellulose oral powder Take 1 packet by mouth daily. Reported on 09/17/2015  . [DISCONTINUED] hyoscyamine (LEVSIN) 0.125 MG/5ML ELIX Take 0.125 mg by mouth every 6 (six) hours as needed for cramping. Reported on 09/17/2015   No facility-administered encounter medications on file as of 09/17/2015.    Follow-up: Annually  [place]

## 2015-09-19 LAB — QUANTIFERON TB GOLD ASSAY (BLOOD)
Interferon Gamma Release Assay: NEGATIVE
Mitogen value: 8.77 IU/mL
QUANTIFERON TB AG MINUS NIL: 0 [IU]/mL
Quantiferon Nil Value: 0.07 IU/mL
TB AG VALUE: 0.07 [IU]/mL

## 2015-09-22 ENCOUNTER — Encounter: Payer: Self-pay | Admitting: Family Medicine

## 2015-09-22 ENCOUNTER — Telehealth: Payer: Self-pay | Admitting: Family Medicine

## 2015-09-22 NOTE — Telephone Encounter (Signed)
Called patient and left another voicemail. When she calls she can be transferred to me or just let her know her lab results and she needs Hep B series scheduled as nurse visit.

## 2015-09-22 NOTE — Telephone Encounter (Signed)
Pt returned Ashley's call.

## 2015-09-23 ENCOUNTER — Encounter: Payer: Self-pay | Admitting: Family Medicine

## 2015-09-24 NOTE — Telephone Encounter (Signed)
Pt scheduled for 1st hepB injection 10/01/15

## 2015-09-24 NOTE — Telephone Encounter (Signed)
[id number]

## 2015-09-24 NOTE — Telephone Encounter (Signed)
Patient requested a call in reference to her lab results Contact [phone]

## 2015-10-01 ENCOUNTER — Ambulatory Visit (INDEPENDENT_AMBULATORY_CARE_PROVIDER_SITE_OTHER): Payer: BLUE CROSS/BLUE SHIELD

## 2015-10-01 DIAGNOSIS — Z23 Encounter for immunization: Secondary | ICD-10-CM | POA: Diagnosis not present

## 2015-10-01 NOTE — Progress Notes (Signed)
Patient came in for 1st Hep B shot.  Received in right  deltoid.  Patient tolerated well.  Scheduled 2nd shot.

## 2015-10-06 ENCOUNTER — Encounter: Payer: Self-pay | Admitting: Family Medicine

## 2015-10-06 ENCOUNTER — Ambulatory Visit (INDEPENDENT_AMBULATORY_CARE_PROVIDER_SITE_OTHER): Payer: BLUE CROSS/BLUE SHIELD | Admitting: Family Medicine

## 2015-10-06 VITALS — BP 108/68 | HR 79 | Temp 98.2°F | Ht 65.5 in | Wt 133.0 lb

## 2015-10-06 DIAGNOSIS — M542 Cervicalgia: Secondary | ICD-10-CM | POA: Diagnosis not present

## 2015-10-06 DIAGNOSIS — G8929 Other chronic pain: Secondary | ICD-10-CM | POA: Insufficient documentation

## 2015-10-06 MED ORDER — DICLOFENAC SODIUM 75 MG PO TBEC: 75.0000 mg | DELAYED_RELEASE_TABLET | Freq: Two times a day (BID) | ORAL | Status: DC | PRN

## 2015-10-06 MED ORDER — CYCLOBENZAPRINE HCL 5 MG PO TABS: 5.0000 mg | ORAL_TABLET | Freq: Three times a day (TID) | ORAL | Status: DC | PRN

## 2015-10-06 NOTE — Progress Notes (Signed)
Pre visit review using our clinic review tool, if applicable. No additional management support is needed unless otherwise documented below in the visit note. 

## 2015-10-06 NOTE — Patient Instructions (Signed)
Use the diclofenac and flexeril as needed.  Use heat and massage.  Follow up if you fail to improve or worsen.  Give it some time.  Take care  Dr. Lacinda Axon

## 2015-10-06 NOTE — Progress Notes (Signed)
Subjective:  Patient ID: Suzanne Cruz, female    DOB: [birth year]  Age: 50 y.o. MRN: [id number]  CC: Neck pain  HPI:  50 year old female presents with complaints of neck pain.  Neck pain  Patient reports she's had neck pain since Thursday.  Pain is located on the left side (trapezius and paraspinal musculature).  She reports it is worse with range of motion of the neck.  She's been using EMLA cream and Tylenol with no relief.  She does report some pain of her left forearm. No reports of numbness/tingling.  No other associated symptoms.  Social Hx   Social History   Social History  . Marital Status: Single    Spouse Name: N/A  . Number of Children: N/A  . Years of Education: N/A   Social History Main Topics  . Smoking status: Never Smoker   . Smokeless tobacco: Never Used  . Alcohol Use: 0.0 - 0.6 oz/week    0-1 Glasses of wine per week  . Drug Use: No  . Sexual Activity: Yes    Birth Control/ Protection: Post-menopausal   Other Topics Concern  . None   Social History Narrative   Review of Systems  Constitutional: Negative.   Musculoskeletal: Positive for neck pain.  Neurological: Negative for numbness.    Objective:  BP 108/68 mmHg  Pulse 79  Temp(Src) 98.2 F (36.8 C) (Oral)  Ht 5' 5.5" (1.664 m)  Wt 133 lb (60.328 kg)  BMI 21.79 kg/m2  SpO2 97%  BP/Weight 10/06/2015 09/17/2015 [id number]  Systolic BP 123XX123 [id number]  Diastolic BP 68 64 68  Wt. (Lbs) 133 130 125  BMI 21.79 21.3 20.19   Physical Exam  Constitutional: She is oriented to person, place, and time. She appears well-developed. No distress.  Cardiovascular: Normal rate and regular rhythm.   Pulmonary/Chest: Effort normal and breath sounds normal.  Musculoskeletal:  Neck - left paraspinal muscular tenderness. Left trapezius muscle spasm noted. Decreased range of motion particularly in flexion and extension as well as left rotation.  Neurological: She is alert and oriented to person, place,  and time.  Vitals reviewed.  Lab Results  Component Value Date   WBC 5.9 08/15/2015   HGB 13.6 08/15/2015   HCT 41 08/15/2015   PLT 236 08/15/2015   GLUCOSE 92 01/17/2015   CHOL 229* 09/17/2015   TRIG 282.0* 09/17/2015   HDL 57.80 09/17/2015   LDLDIRECT 125.0 09/17/2015   ALT 13* 01/17/2015   AST 15 08/15/2015   NA 141 08/15/2015   K 4.3 08/15/2015   CL 104 01/17/2015   CREATININE 0.9 08/15/2015   BUN 11 08/15/2015   CO2 28 01/17/2015   HGBA1C 5.8 08/15/2015    Assessment & Plan:   Problem List Items Addressed This Visit    Neck pain - Primary    New problem. MSK in nature.  No indication for imaging at this time. Treating with short course of Diclofenac and PRN Flexeril. Also advised massage and heat.         Meds ordered this encounter  Medications  . cyclobenzaprine (FLEXERIL) 5 MG tablet    Sig: Take 1 tablet (5 mg total) by mouth 3 (three) times daily as needed for muscle spasms.    Dispense:  30 tablet    Refill:  0  . diclofenac (VOLTAREN) 75 MG EC tablet    Sig: Take 1 tablet (75 mg total) by mouth 2 (two) times daily as needed.    Dispense:  30 tablet    Refill:  0    Follow-up: PRN  [place]

## 2015-10-06 NOTE — Assessment & Plan Note (Signed)
New problem. MSK in nature.  No indication for imaging at this time. Treating with short course of Diclofenac and PRN Flexeril. Also advised massage and heat.

## 2015-10-15 ENCOUNTER — Telehealth: Payer: Self-pay | Admitting: Family Medicine

## 2015-10-15 NOTE — Telephone Encounter (Signed)
What else would she be able to take. Please advise?

## 2015-10-15 NOTE — Telephone Encounter (Signed)
Stop medication. Tylenol, heat.

## 2015-10-15 NOTE — Telephone Encounter (Signed)
The patient stated that the medication that was prescribed for her neck pain is causing her to have stomach ulcers. She is requesting something else for her neck pain. She has discontinued the medication.

## 2015-10-16 NOTE — Telephone Encounter (Signed)
Left voicemail stating to stop medication and try heat with tylenol.

## 2015-10-29 ENCOUNTER — Ambulatory Visit (INDEPENDENT_AMBULATORY_CARE_PROVIDER_SITE_OTHER): Payer: BLUE CROSS/BLUE SHIELD

## 2015-10-29 DIAGNOSIS — Z23 Encounter for immunization: Secondary | ICD-10-CM | POA: Diagnosis not present

## 2015-10-29 DIAGNOSIS — R300 Dysuria: Secondary | ICD-10-CM

## 2015-10-29 NOTE — Addendum Note (Signed)
Addended by: Karlene Einstein D on: 10/29/2015 05:02 PM   Modules accepted: Orders

## 2015-10-29 NOTE — Progress Notes (Signed)
Patient came in for second Hepatitis B vaccine.  Received in left deltoid.  Completed form for patient.   Patient tolerated well.

## 2015-10-30 LAB — URINALYSIS, ROUTINE W REFLEX MICROSCOPIC
BILIRUBIN URINE: NEGATIVE
Ketones, ur: NEGATIVE
Leukocytes, UA: NEGATIVE
NITRITE: NEGATIVE
PH: 6 (ref 5.0–8.0)
Specific Gravity, Urine: 1.02 (ref 1.000–1.030)
Total Protein, Urine: NEGATIVE
Urine Glucose: NEGATIVE
Urobilinogen, UA: 0.2 (ref 0.0–1.0)

## 2015-10-31 ENCOUNTER — Encounter: Payer: Self-pay | Admitting: *Deleted

## 2015-11-02 LAB — URINE CULTURE

## 2015-11-03 ENCOUNTER — Telehealth: Payer: Self-pay | Admitting: *Deleted

## 2015-11-03 NOTE — Telephone Encounter (Signed)
Tanya please advise, thanks

## 2015-11-03 NOTE — Telephone Encounter (Signed)
Patient will need her third hep b vaccine, what is the time period before she can be scheduled for her third vaccine.

## 2015-11-04 NOTE — Telephone Encounter (Signed)
She is due on August 1st for the third one.

## 2015-11-19 ENCOUNTER — Other Ambulatory Visit: Payer: Self-pay | Admitting: Physician Assistant

## 2015-11-19 DIAGNOSIS — R1032 Left lower quadrant pain: Secondary | ICD-10-CM

## 2015-11-19 DIAGNOSIS — R1031 Right lower quadrant pain: Secondary | ICD-10-CM

## 2015-11-19 DIAGNOSIS — R194 Change in bowel habit: Secondary | ICD-10-CM

## 2015-11-19 DIAGNOSIS — K6289 Other specified diseases of anus and rectum: Secondary | ICD-10-CM

## 2015-11-19 DIAGNOSIS — Z8719 Personal history of other diseases of the digestive system: Secondary | ICD-10-CM

## 2015-11-20 ENCOUNTER — Ambulatory Visit
Admission: RE | Admit: 2015-11-20 | Discharge: 2015-11-20 | Disposition: A | Discharge status: Home / Self Care | Payer: BLUE CROSS/BLUE SHIELD | Source: Ambulatory Visit | Attending: Physician Assistant | Admitting: Physician Assistant

## 2015-11-20 DIAGNOSIS — R1032 Left lower quadrant pain: Secondary | ICD-10-CM | POA: Insufficient documentation

## 2015-11-20 DIAGNOSIS — R194 Change in bowel habit: Secondary | ICD-10-CM | POA: Diagnosis present

## 2015-11-20 DIAGNOSIS — Z8719 Personal history of other diseases of the digestive system: Secondary | ICD-10-CM

## 2015-11-20 DIAGNOSIS — R1031 Right lower quadrant pain: Secondary | ICD-10-CM

## 2015-11-20 DIAGNOSIS — K6289 Other specified diseases of anus and rectum: Secondary | ICD-10-CM | POA: Diagnosis not present

## 2015-11-20 MED ORDER — IOPAMIDOL (ISOVUE-300) INJECTION 61%
100.0000 mL | Freq: Once | INTRAVENOUS | Status: AC | PRN
  Administered 2015-11-20: 100 mL via INTRAVENOUS

## 2016-01-08 ENCOUNTER — Other Ambulatory Visit: Payer: Self-pay | Admitting: Family Medicine

## 2016-01-08 NOTE — Telephone Encounter (Signed)
ADDERALL XR 30 MG 24 hr capsule

## 2016-01-08 NOTE — Telephone Encounter (Signed)
Please advise refill, thanks 

## 2016-01-09 MED ORDER — ADDERALL XR 30 MG PO CP24: 30.0000 mg | ORAL_CAPSULE | Freq: Every day | ORAL | Status: DC

## 2016-01-09 NOTE — Telephone Encounter (Signed)
Pt notified Rx ready for pickup 

## 2016-01-20 ENCOUNTER — Other Ambulatory Visit: Payer: Self-pay | Admitting: Gynecology

## 2016-01-20 DIAGNOSIS — Z1231 Encounter for screening mammogram for malignant neoplasm of breast: Secondary | ICD-10-CM

## 2016-02-04 ENCOUNTER — Telehealth: Payer: Self-pay | Admitting: Family Medicine

## 2016-02-04 NOTE — Telephone Encounter (Signed)
Copy ready for pick up and patient is aware.

## 2016-02-04 NOTE — Telephone Encounter (Signed)
Pt called about needing documentation stating she has had her immunizations. Please call pt when it's ready.   Call pt @ [phone]. Thank you!

## 2016-02-24 DIAGNOSIS — L814 Other melanin hyperpigmentation: Secondary | ICD-10-CM | POA: Diagnosis not present

## 2016-03-30 ENCOUNTER — Ambulatory Visit: Payer: BLUE CROSS/BLUE SHIELD

## 2016-04-09 ENCOUNTER — Ambulatory Visit (INDEPENDENT_AMBULATORY_CARE_PROVIDER_SITE_OTHER): Payer: BLUE CROSS/BLUE SHIELD | Admitting: Family Medicine

## 2016-04-09 ENCOUNTER — Encounter (INDEPENDENT_AMBULATORY_CARE_PROVIDER_SITE_OTHER): Payer: Self-pay

## 2016-04-09 ENCOUNTER — Encounter: Payer: Self-pay | Admitting: Family Medicine

## 2016-04-09 VITALS — BP 106/73 | HR 90 | Temp 98.3°F | Wt 133.2 lb

## 2016-04-09 DIAGNOSIS — M542 Cervicalgia: Secondary | ICD-10-CM

## 2016-04-09 DIAGNOSIS — F909 Attention-deficit hyperactivity disorder, unspecified type: Secondary | ICD-10-CM

## 2016-04-09 DIAGNOSIS — F900 Attention-deficit hyperactivity disorder, predominantly inattentive type: Secondary | ICD-10-CM

## 2016-04-09 DIAGNOSIS — F419 Anxiety disorder, unspecified: Secondary | ICD-10-CM | POA: Diagnosis not present

## 2016-04-09 DIAGNOSIS — F329 Major depressive disorder, single episode, unspecified: Secondary | ICD-10-CM | POA: Insufficient documentation

## 2016-04-09 DIAGNOSIS — F418 Other specified anxiety disorders: Secondary | ICD-10-CM

## 2016-04-09 DIAGNOSIS — Z23 Encounter for immunization: Secondary | ICD-10-CM

## 2016-04-09 DIAGNOSIS — F32A Depression, unspecified: Secondary | ICD-10-CM | POA: Insufficient documentation

## 2016-04-09 MED ORDER — AMPHETAMINE-DEXTROAMPHET ER 30 MG PO CP24: 30.0000 mg | ORAL_CAPSULE | Freq: Every day | ORAL | 0 refills | Status: DC

## 2016-04-09 MED ORDER — DICLOFENAC SODIUM 75 MG PO TBEC: 75.0000 mg | DELAYED_RELEASE_TABLET | Freq: Two times a day (BID) | ORAL | 0 refills | Status: DC | PRN

## 2016-04-09 MED ORDER — CYCLOBENZAPRINE HCL 5 MG PO TABS: 5.0000 mg | ORAL_TABLET | Freq: Three times a day (TID) | ORAL | 0 refills | Status: DC | PRN

## 2016-04-09 NOTE — Progress Notes (Signed)
Pre visit review using our clinic review tool, if applicable. No additional management support is needed unless otherwise documented below in the visit note. 

## 2016-04-09 NOTE — Assessment & Plan Note (Addendum)
Stable. Doing well on Adderall. Refilled today x 3 months.

## 2016-04-09 NOTE — Progress Notes (Signed)
Subjective:  Patient ID: Suzanne Cruz, female    DOB: [birth year]  Age: 50 y.o. MRN: [id number]  CC: Follow up/medication refill.  HPI:  50 year old female with ADHD presents for follow up.  ADHD  Stable.  Requesting refill on Adderall today.  [place] database reviewed. No issues or discrepancies.  Test anxiety  New problem.   Has been going on for months.   Patient states that she has significant test anxiety. Overwhelmed at times. Fasted paced class with frequent testing.   She states that she has been taking a previously prescribed Prozac.  No know exacerbating factors. She just struggles with the anxiety around test taking/performance.  She would like to discuss this today.  Neck pain  Improved but has periodic flares.  Stable at this time.  Requesting refill on Diclofenac and Flexeril.   Social Hx   Social History   Social History  . Marital status: Single    Spouse name: N/A  . Number of children: N/A  . Years of education: N/A   Social History Main Topics  . Smoking status: Never Smoker  . Smokeless tobacco: Never Used  . Alcohol use 0.0 - 0.6 oz/week  . Drug use: No  . Sexual activity: Yes    Birth control/ protection: Post-menopausal   Other Topics Concern  . None   Social History Narrative  . None   Review of Systems  Constitutional: Negative.   Psychiatric/Behavioral: Positive for decreased concentration. The patient is nervous/anxious.    Objective:  BP 106/73 (BP Location: Right Arm, Patient Position: Sitting, Cuff Size: Normal)   Pulse 90   Temp 98.3 F (36.8 C) (Oral)   Wt 133 lb 4 oz (60.4 kg)   SpO2 100%   BMI 21.84 kg/m   BP/Weight 04/09/2016 10/06/2015 99991111  Systolic BP A999333 123XX123 123XX123  Diastolic BP 73 68 64  Wt. (Lbs) 133.25 133 130  BMI 21.84 21.79 21.3   Physical Exam  Constitutional: She is oriented to person, place, and time. She appears well-developed. No distress.  HENT:  Head: Normocephalic and atraumatic.    Eyes: Conjunctivae are normal. No scleral icterus.  Pulmonary/Chest: Effort normal.  Neurological: She is alert and oriented to person, place, and time.  Skin: Skin is warm and dry.  Psychiatric: She has a normal mood and affect. Her behavior is normal. Judgment and thought content normal.  Vitals reviewed.  Lab Results  Component Value Date   WBC 5.9 08/15/2015   HGB 13.6 08/15/2015   HCT 41 08/15/2015   PLT 236 08/15/2015   GLUCOSE 92 01/17/2015   CHOL 229 (H) 09/17/2015   TRIG 282.0 (H) 09/17/2015   HDL 57.80 09/17/2015   LDLDIRECT 125.0 09/17/2015   ALT 13 (L) 01/17/2015   AST 15 08/15/2015   NA 141 08/15/2015   K 4.3 08/15/2015   CL 104 01/17/2015   CREATININE 0.9 08/15/2015   BUN 11 08/15/2015   CO2 28 01/17/2015   HGBA1C 5.8 08/15/2015    Assessment & Plan:   Problem List Items Addressed This Visit    Adult ADHD    Stable. Doing well on Adderall. Refilled today x 3 months.      Neck pain    Stable/improved. Continue PRN Diclofenac and Flexeril. Refilled today.       Test anxiety    New problem. Discussed treatment options today. Advocated for conservative measures/coping strategies in lieu of medication. Patient in agreement.       Other Visit  Diagnoses    Need for prophylactic vaccination and inoculation against viral hepatitis    -  Primary   Relevant Orders   Hepatitis B vaccine adult IM (Completed)      Meds ordered this encounter  Medications  . diclofenac (VOLTAREN) 75 MG EC tablet    Sig: Take 1 tablet (75 mg total) by mouth 2 (two) times daily as needed.    Dispense:  30 tablet    Refill:  0  . cyclobenzaprine (FLEXERIL) 5 MG tablet    Sig: Take 1 tablet (5 mg total) by mouth 3 (three) times daily as needed for muscle spasms.    Dispense:  30 tablet    Refill:  0  . amphetamine-dextroamphetamine (ADDERALL XR) 30 MG 24 hr capsule    Sig: Take 1 capsule (30 mg total) by mouth daily.    Dispense:  30 capsule    Refill:  0  .  amphetamine-dextroamphetamine (ADDERALL XR) 30 MG 24 hr capsule    Sig: Take 1 capsule (30 mg total) by mouth daily.    Dispense:  30 capsule    Refill:  0    Do not fill before 9/11.  Marland Kitchen amphetamine-dextroamphetamine (ADDERALL XR) 30 MG 24 hr capsule    Sig: Take 1 capsule (30 mg total) by mouth daily. Do not fill before 10/11.    Dispense:  30 capsule    Refill:  0   Follow-up: 3 months.  [place]

## 2016-04-09 NOTE — Patient Instructions (Signed)
Let me know if you decide you want something additional for test anxiety.  Follow up in 3 months.  Take care  Dr. Lacinda Axon

## 2016-04-09 NOTE — Assessment & Plan Note (Signed)
Stable/improved. Continue PRN Diclofenac and Flexeril. Refilled today.

## 2016-04-09 NOTE — Assessment & Plan Note (Signed)
New problem. Discussed treatment options today. Advocated for conservative measures/coping strategies in lieu of medication. Patient in agreement.

## 2016-06-12 DIAGNOSIS — H1132 Conjunctival hemorrhage, left eye: Secondary | ICD-10-CM | POA: Diagnosis not present

## 2016-06-16 DIAGNOSIS — H179 Unspecified corneal scar and opacity: Secondary | ICD-10-CM | POA: Diagnosis not present

## 2016-07-02 DIAGNOSIS — M542 Cervicalgia: Secondary | ICD-10-CM | POA: Diagnosis not present

## 2016-07-02 DIAGNOSIS — M530 Cervicocranial syndrome: Secondary | ICD-10-CM | POA: Diagnosis not present

## 2016-07-02 DIAGNOSIS — M9903 Segmental and somatic dysfunction of lumbar region: Secondary | ICD-10-CM | POA: Diagnosis not present

## 2016-07-02 DIAGNOSIS — M9901 Segmental and somatic dysfunction of cervical region: Secondary | ICD-10-CM | POA: Diagnosis not present

## 2016-07-11 DIAGNOSIS — R197 Diarrhea, unspecified: Secondary | ICD-10-CM | POA: Diagnosis not present

## 2016-07-11 DIAGNOSIS — R109 Unspecified abdominal pain: Secondary | ICD-10-CM | POA: Diagnosis not present

## 2016-07-12 ENCOUNTER — Telehealth: Payer: Self-pay | Admitting: *Deleted

## 2016-07-12 NOTE — Telephone Encounter (Signed)
Pt requested if she could have a Rx for flagyl called in, she previously took this medication for diverticulitis . She had a old Rx, she took 3 dosage of flagyl it seem to help her stomach pain.  Pt stated that she has a hx of diverticulitis  Pt contact [phone]

## 2016-07-12 NOTE — Telephone Encounter (Signed)
Will need to be seen

## 2016-07-12 NOTE — Telephone Encounter (Signed)
Do you want her to be seen first?

## 2016-07-13 NOTE — Telephone Encounter (Signed)
Please have pt schedule an appt to be seen for this.

## 2016-07-13 NOTE — Telephone Encounter (Signed)
Left a voicemail to schedule a appt

## 2016-07-15 ENCOUNTER — Ambulatory Visit: Payer: BLUE CROSS/BLUE SHIELD | Admitting: Family Medicine

## 2016-07-15 DIAGNOSIS — Z0289 Encounter for other administrative examinations: Secondary | ICD-10-CM

## 2016-07-20 ENCOUNTER — Ambulatory Visit (INDEPENDENT_AMBULATORY_CARE_PROVIDER_SITE_OTHER): Payer: BLUE CROSS/BLUE SHIELD | Admitting: Family Medicine

## 2016-07-20 ENCOUNTER — Encounter: Payer: Self-pay | Admitting: Family Medicine

## 2016-07-20 DIAGNOSIS — K589 Irritable bowel syndrome without diarrhea: Secondary | ICD-10-CM | POA: Insufficient documentation

## 2016-07-20 DIAGNOSIS — F419 Anxiety disorder, unspecified: Secondary | ICD-10-CM | POA: Diagnosis not present

## 2016-07-20 MED ORDER — CLONAZEPAM 0.5 MG PO TABS: 0.5000 mg | ORAL_TABLET | Freq: Two times a day (BID) | ORAL | 1 refills | Status: DC | PRN

## 2016-07-20 NOTE — Progress Notes (Signed)
   Subjective:  Patient ID: Suzanne Cruz, female    DOB: [birth year]  Age: 50 y.o. MRN: [id number]  CC: Anxiety  HPI:  50 year old female presents with complaints of anxiety.  Patient reports that she's had ongoing anxiety. She states it has been worse for the past 2 weeks. 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 She reports associated abdominal pain/worsening of IBS. No known relieving factors. Exacerbating by clinicals/nursing school. She states that one of her instructors told her she "needs to get a handle on this". She would like to discuss medication options today.  Social Hx   Social History   Social History  . Marital status: Single    Spouse name: N/A  . Number of children: N/A  . Years of education: N/A   Social History Main Topics  . Smoking status: Never Smoker  . Smokeless tobacco: Never Used  . Alcohol use 0.0 - 0.6 oz/week  . Drug use: No  . Sexual activity: Yes    Birth control/ protection: Post-menopausal   Other Topics Concern  . None   Social History Narrative  . None    Review of Systems  Gastrointestinal: Positive for abdominal pain.  Psychiatric/Behavioral: The patient is nervous/anxious.    Objective:  BP 114/76 (BP Location: Left Arm, Patient Position: Sitting, Cuff Size: Normal)   Pulse (!) 110   Temp 98.1 F (36.7 C) (Oral)   Resp 12   Wt 133 lb 4 oz (60.4 kg)   SpO2 100%   BMI 21.84 kg/m   BP/Weight 07/20/2016 A999333 99991111  Systolic BP 99991111 A999333 123XX123  Diastolic BP 76 73 68  Wt. (Lbs) 133.25 133.25 133  BMI 21.84 21.84 21.79    Physical Exam  Constitutional: She is oriented to person, place, and time. She appears well-developed. No distress.  Cardiovascular: Normal rate and regular rhythm.   Pulmonary/Chest: Effort normal.  Neurological: She is alert and oriented to person, place, and time.  Psychiatric: She has a normal mood and affect.  Vitals reviewed.   Lab Results  Component Value Date   WBC 5.9  08/15/2015   HGB 13.6 08/15/2015   HCT 41 08/15/2015   PLT 236 08/15/2015   GLUCOSE 92 01/17/2015   CHOL 229 (H) 09/17/2015   TRIG 282.0 (H) 09/17/2015   HDL 57.80 09/17/2015   LDLDIRECT 125.0 09/17/2015   ALT 13 (L) 01/17/2015   AST 15 08/15/2015   NA 141 08/15/2015   K 4.3 08/15/2015   CL 104 01/17/2015   CREATININE 0.9 08/15/2015   BUN 11 08/15/2015   CO2 28 01/17/2015   HGBA1C 5.8 08/15/2015    Assessment & Plan:   Problem List Items Addressed This Visit    Anxiety    New problem. Trial of klonopin.         Meds ordered this encounter  Medications  . clonazePAM (KLONOPIN) 0.5 MG tablet    Sig: Take 1 tablet (0.5 mg total) by mouth 2 (two) times daily as needed for anxiety.    Dispense:  60 tablet    Refill:  1    Follow-up: PRN  [place]

## 2016-07-20 NOTE — Progress Notes (Signed)
Pre visit review using our clinic review tool, if applicable. No additional management support is needed unless otherwise documented below in the visit note. 

## 2016-07-20 NOTE — Assessment & Plan Note (Signed)
New problem. Trial of klonopin.

## 2016-07-20 NOTE — Patient Instructions (Signed)
Use the medication as needed.  Best of luck  Happy Thanksgiving  Dr. Lacinda Axon

## 2016-08-21 DIAGNOSIS — J988 Other specified respiratory disorders: Secondary | ICD-10-CM | POA: Diagnosis not present

## 2016-09-03 ENCOUNTER — Ambulatory Visit (INDEPENDENT_AMBULATORY_CARE_PROVIDER_SITE_OTHER): Payer: BLUE CROSS/BLUE SHIELD | Admitting: Family Medicine

## 2016-09-03 ENCOUNTER — Telehealth: Payer: Self-pay | Admitting: Family Medicine

## 2016-09-03 ENCOUNTER — Encounter: Payer: Self-pay | Admitting: Family Medicine

## 2016-09-03 VITALS — BP 117/75 | HR 108 | Temp 98.1°F | Resp 12 | Wt 130.6 lb

## 2016-09-03 DIAGNOSIS — R3989 Other symptoms and signs involving the genitourinary system: Secondary | ICD-10-CM | POA: Diagnosis not present

## 2016-09-03 DIAGNOSIS — R350 Frequency of micturition: Secondary | ICD-10-CM | POA: Diagnosis not present

## 2016-09-03 DIAGNOSIS — R319 Hematuria, unspecified: Secondary | ICD-10-CM | POA: Diagnosis not present

## 2016-09-03 DIAGNOSIS — F419 Anxiety disorder, unspecified: Secondary | ICD-10-CM

## 2016-09-03 LAB — POCT URINALYSIS DIPSTICK
Bilirubin, UA: NEGATIVE
Glucose, UA: NEGATIVE
Ketones, UA: NEGATIVE
LEUKOCYTES UA: NEGATIVE
NITRITE UA: NEGATIVE
PH UA: 6
PROTEIN UA: NEGATIVE
Spec Grav, UA: 1.02
UROBILINOGEN UA: 0.2

## 2016-09-03 MED ORDER — FLUOXETINE HCL 40 MG PO CAPS: 40.0000 mg | ORAL_CAPSULE | Freq: Every day | ORAL | 3 refills | Status: DC

## 2016-09-03 NOTE — Assessment & Plan Note (Signed)
New problem. UA with small blood. Sending for culture. Will awaiting culture results prior to antibiotic.

## 2016-09-03 NOTE — Progress Notes (Addendum)
Subjective:  Patient ID: Suzanne Cruz, female    DOB: 1965-09-05  Age: 51 y.o. MRN: [id number]  CC: ? UTI, Anxiety  HPI:  51 year old female presents with the above complaints.  ? UTI  Patient reports that she's had a "bladder pain" for the past few weeks.  She reports that she's had frequency and urgency.  No dysuria.  She states that it seems to be worse when she takes her Adderall. Improves when she doesn't take it.  She is unsure of whether her hydration status is contributing.  No fevers or chills.  No current flank pain.  No other associated symptoms.  Anxiety  Did not tolerate Klonopin. Reports excessive sedation.  She states that she has been on Prozac in the past and done well.  She would like to discuss restart today.  Social Hx   Social History   Social History  . Marital status: Single    Spouse name: N/A  . Number of children: N/A  . Years of education: N/A   Social History Main Topics  . Smoking status: Never Smoker  . Smokeless tobacco: Never Used  . Alcohol use 0.0 - 0.6 oz/week  . Drug use: No  . Sexual activity: Yes    Birth control/ protection: Post-menopausal   Other Topics Concern  . None   Social History Narrative  . None   Review of Systems  Gastrointestinal:       "Bladder pain"  Genitourinary: Positive for frequency and urgency.  Psychiatric/Behavioral: The patient is nervous/anxious.    Objective:  BP 117/75   Pulse (!) 108   Temp 98.1 F (36.7 C) (Oral)   Resp 12   Wt 130 lb 9.6 oz (59.2 kg)   SpO2 98%   BMI 21.40 kg/m   BP/Weight 09/03/2016 07/20/2016 A999333  Systolic BP 123XX123 99991111 A999333  Diastolic BP 75 76 73  Wt. (Lbs) 130.6 133.25 133.25  BMI 21.4 21.84 21.84   Physical Exam  Constitutional: She is oriented to person, place, and time. She appears well-developed. No distress.  Cardiovascular: Normal rate and regular rhythm.   Pulmonary/Chest: Effort normal.  Abdominal: Soft.  Mild suprapubic pain.    Neurological: She is alert and oriented to person, place, and time.  Psychiatric: She has a normal mood and affect.  Vitals reviewed.  Results for orders placed or performed in visit on 09/03/16 (from the past 24 hour(s))  POCT Urinalysis Dipstick     Status: Abnormal   Collection Time: 09/03/16  1:32 PM  Result Value Ref Range   Color, UA yellow    Clarity, UA clear    Glucose, UA negative    Bilirubin, UA negative    Ketones, UA negative    Spec Grav, UA 1.020    Blood, UA small    pH, UA 6.0    Protein, UA negative    Urobilinogen, UA 0.2    Nitrite, UA negative    Leukocytes, UA Negative Negative    Assessment & Plan:   Problem List Items Addressed This Visit    Urinary frequency - Primary    New problem. UA with small blood. Sending for culture. Will awaiting culture results prior to antibiotic.      Relevant Orders   POCT Urinalysis Dipstick (Completed)   Urine Culture   Anxiety    Uncontrolled, worsening. Starting Prozac. Stopping Klonopin.      Relevant Medications   FLUoxetine (PROZAC) 40 MG capsule      Meds ordered  this encounter  Medications  . FLUoxetine (PROZAC) 40 MG capsule    Sig: Take 1 capsule (40 mg total) by mouth daily.    Dispense:  90 capsule    Refill:  3   Follow-up: PRN  [place]

## 2016-09-03 NOTE — Progress Notes (Signed)
Pre visit review using our clinic review tool, if applicable. No additional management support is needed unless otherwise documented below in the visit note. 

## 2016-09-03 NOTE — Assessment & Plan Note (Signed)
Uncontrolled, worsening. Starting Prozac. Stopping Klonopin.

## 2016-09-03 NOTE — Patient Instructions (Signed)
We will call regarding the urine culture.  Stay hydrate.  Continue the prozac.  Take care  Dr. Lacinda Axon

## 2016-09-05 LAB — URINE CULTURE

## 2016-09-11 DIAGNOSIS — J069 Acute upper respiratory infection, unspecified: Secondary | ICD-10-CM | POA: Diagnosis not present

## 2016-09-21 ENCOUNTER — Ambulatory Visit: Payer: BLUE CROSS/BLUE SHIELD | Admitting: Family Medicine

## 2016-09-30 ENCOUNTER — Encounter: Payer: Self-pay | Admitting: Family Medicine

## 2016-09-30 ENCOUNTER — Ambulatory Visit (INDEPENDENT_AMBULATORY_CARE_PROVIDER_SITE_OTHER): Payer: BLUE CROSS/BLUE SHIELD | Admitting: Family Medicine

## 2016-09-30 DIAGNOSIS — R102 Pelvic and perineal pain: Secondary | ICD-10-CM | POA: Diagnosis not present

## 2016-09-30 MED ORDER — AMPHETAMINE-DEXTROAMPHET ER 30 MG PO CP24: 30.0000 mg | ORAL_CAPSULE | Freq: Every day | ORAL | 0 refills | Status: DC

## 2016-09-30 MED ORDER — OMEPRAZOLE 40 MG PO CPDR: 40.0000 mg | DELAYED_RELEASE_CAPSULE | Freq: Every day | ORAL | 1 refills | Status: DC

## 2016-09-30 NOTE — Progress Notes (Signed)
Subjective:  Patient ID: Suzanne Cruz, female    DOB: [birth year]  Age: 51 y.o. MRN: [id number]  CC: Pelvic pain  HPI:  51 year old female presents with complaints of pelvic pain.  Patient was recently seen on 1/5 for urinary frequency and suprapubic pain. Her urine culture returned negative.  Patient reports that since that time she has continued to have her pubic pain as well as right-sided pelvic pain/right lower quadrant pain. Patient denies any changes in her bowel or bladder. No reports of constipation or diarrhea. No fever. No associated nausea or vomiting. She states that her pain is intermittent and severe. Patient has had an abdominal hysterectomy and left oophorectomy. No known exacerbating or relieving factors. She is concerned that this is an issue with her ovary. She would like to discuss this today.  Social Hx   Social History   Social History  . Marital status: Single    Spouse name: N/A  . Number of children: N/A  . Years of education: N/A   Social History Main Topics  . Smoking status: Never Smoker  . Smokeless tobacco: Never Used  . Alcohol use 0.0 - 0.6 oz/week  . Drug use: No  . Sexual activity: Yes    Birth control/ protection: Post-menopausal   Other Topics Concern  . None   Social History Narrative  . None   Review of Systems  Constitutional: Negative.   Gastrointestinal: Negative for blood in stool, constipation, diarrhea, nausea and vomiting.  Genitourinary: Positive for pelvic pain.   Objective:  BP 102/64   Pulse (!) 111   Temp 97.8 F (36.6 C) (Oral)   Wt 128 lb 3.2 oz (58.2 kg)   SpO2 99%   BMI 21.01 kg/m   BP/Weight 09/30/2016 09/03/2016 99991111  Systolic BP A999333 123XX123 99991111  Diastolic BP 64 75 76  Wt. (Lbs) 128.2 130.6 133.25  BMI 21.01 21.4 21.84   Physical Exam  Constitutional: She is oriented to person, place, and time. She appears well-developed. No distress.  Cardiovascular: Regular rhythm.  Tachycardia present.     Pulmonary/Chest: Effort normal and breath sounds normal.  Abdominal: Soft.  Mild tenderness in suprapubic region.  Neurological: She is alert and oriented to person, place, and time.  Psychiatric: She has a normal mood and affect.  Vitals reviewed.  Lab Results  Component Value Date   WBC 5.9 08/15/2015   HGB 13.6 08/15/2015   HCT 41 08/15/2015   PLT 236 08/15/2015   GLUCOSE 92 01/17/2015   CHOL 229 (H) 09/17/2015   TRIG 282.0 (H) 09/17/2015   HDL 57.80 09/17/2015   LDLDIRECT 125.0 09/17/2015   ALT 13 (L) 01/17/2015   AST 15 08/15/2015   NA 141 08/15/2015   K 4.3 08/15/2015   CL 104 01/17/2015   CREATININE 0.9 08/15/2015   BUN 11 08/15/2015   CO2 28 01/17/2015   HGBA1C 5.8 08/15/2015    Assessment & Plan:   Problem List Items Addressed This Visit    Pelvic pain    New problem. Uncertain etiology/prognosis at this time. Patient with prior suprapubic pain and urinary symptoms. Now with continued suprapubic pain and new right-sided pelvic pain Discuss ultrasound versus just going directly to GYN for further evaluation. She elected for the latter. Will arrange referral.      Relevant Orders   Ambulatory referral to Gynecology      Meds ordered this encounter  Medications  . amphetamine-dextroamphetamine (ADDERALL XR) 30 MG 24 hr capsule    Sig:  Take 1 capsule (30 mg total) by mouth daily.    Dispense:  90 capsule    Refill:  0  . omeprazole (PRILOSEC) 40 MG capsule    Sig: Take 1 capsule (40 mg total) by mouth daily.    Dispense:  90 capsule    Refill:  1    Follow-up: PRN  [place]

## 2016-09-30 NOTE — Assessment & Plan Note (Signed)
New problem. Uncertain etiology/prognosis at this time. Patient with prior suprapubic pain and urinary symptoms. Now with continued suprapubic pain and new right-sided pelvic pain Discuss ultrasound versus just going directly to GYN for further evaluation. She elected for the latter. Will arrange referral.

## 2016-09-30 NOTE — Patient Instructions (Signed)
We will call with your referral.  Take care  Dr. Joni Colegrove  

## 2016-09-30 NOTE — Progress Notes (Signed)
Pre visit review using our clinic review tool, if applicable. No additional management support is needed unless otherwise documented below in the visit note. 

## 2016-11-25 DIAGNOSIS — J069 Acute upper respiratory infection, unspecified: Secondary | ICD-10-CM | POA: Diagnosis not present

## 2016-12-02 DIAGNOSIS — J209 Acute bronchitis, unspecified: Secondary | ICD-10-CM | POA: Diagnosis not present

## 2016-12-02 DIAGNOSIS — N390 Urinary tract infection, site not specified: Secondary | ICD-10-CM | POA: Diagnosis not present

## 2016-12-14 ENCOUNTER — Ambulatory Visit (INDEPENDENT_AMBULATORY_CARE_PROVIDER_SITE_OTHER): Payer: BLUE CROSS/BLUE SHIELD | Admitting: Family Medicine

## 2016-12-14 ENCOUNTER — Encounter: Payer: Self-pay | Admitting: Family Medicine

## 2016-12-14 ENCOUNTER — Ambulatory Visit (INDEPENDENT_AMBULATORY_CARE_PROVIDER_SITE_OTHER): Payer: BLUE CROSS/BLUE SHIELD

## 2016-12-14 ENCOUNTER — Other Ambulatory Visit: Payer: Self-pay | Admitting: Family Medicine

## 2016-12-14 VITALS — BP 114/76 | HR 108 | Temp 98.0°F | Wt 124.4 lb

## 2016-12-14 DIAGNOSIS — R112 Nausea with vomiting, unspecified: Secondary | ICD-10-CM | POA: Diagnosis not present

## 2016-12-14 DIAGNOSIS — J209 Acute bronchitis, unspecified: Secondary | ICD-10-CM | POA: Diagnosis not present

## 2016-12-14 DIAGNOSIS — R918 Other nonspecific abnormal finding of lung field: Secondary | ICD-10-CM | POA: Diagnosis not present

## 2016-12-14 MED ORDER — LEVOFLOXACIN 500 MG PO TABS: 500.0000 mg | ORAL_TABLET | Freq: Every day | ORAL | 0 refills | Status: DC

## 2016-12-14 NOTE — Progress Notes (Signed)
Pre visit review using our clinic review tool, if applicable. No additional management support is needed unless otherwise documented below in the visit note. 

## 2016-12-14 NOTE — Assessment & Plan Note (Signed)
Currently improving. She has some Zofran at home. I advised her to continue to use it as needed.

## 2016-12-14 NOTE — Progress Notes (Signed)
Subjective:  Patient ID: Suzanne Cruz, female    DOB: 11-17-1965  Age: 51 y.o. MRN: [id number]  CC: Not feeling well - Cough, N/V  HPI:  51 year old female presents with the above complaints.  Patient states that she's been sick for the past 3 weeks. She's had cough and congestion. She also reports associated rhinorrhea and subjective fever. She's recently had some shortness of breath. She was seen at urgent care and was placed on albuterol and prednisone. She did not use/take any of these medications. She reports that she's had some improvement in her symptoms but continues to have cough and generalized fatigue/malaise. Also, she recently went on a cruise and developed urinary symptoms. She was treated empirically for UTI. Culture returned back negative and antibiotics were stopped. Last night she developed nausea and vomiting. She has taken some Zofran with improvement. However, she continues to have decreased PO intake/appetite.    Social Hx   Social History   Social History  . Marital status: Single    Spouse name: N/A  . Number of children: N/A  . Years of education: N/A   Social History Main Topics  . Smoking status: Never Smoker  . Smokeless tobacco: Never Used  . Alcohol use 0.0 - 0.6 oz/week  . Drug use: No  . Sexual activity: Yes    Birth control/ protection: Post-menopausal   Other Topics Concern  . None   Social History Narrative  . None    Review of Systems  Constitutional: Positive for fatigue.  HENT: Positive for congestion.   Respiratory: Positive for cough.   Gastrointestinal: Positive for nausea and vomiting.   Objective:  BP 114/76   Pulse (!) 108   Temp 98 F (36.7 C) (Oral)   Wt 124 lb 6 oz (56.4 kg)   SpO2 98%   BMI 20.38 kg/m   BP/Weight 12/14/2016 4/0/9811 04/30/4781  Systolic BP 956 213 086  Diastolic BP 76 64 75  Wt. (Lbs) 124.38 128.2 130.6  BMI 20.38 21.01 21.4    Physical Exam  Constitutional: She appears well-developed. No  distress.  HENT:  Mouth/Throat: Oropharynx is clear and moist.  Cardiovascular: Normal rate and regular rhythm.   Pulmonary/Chest: Effort normal and breath sounds normal.  Abdominal: Soft.  Tenderness in the epigastric region.  Neurological: She is alert.  Vitals reviewed.  Lab Results  Component Value Date   WBC 5.9 08/15/2015   HGB 13.6 08/15/2015   HCT 41 08/15/2015   PLT 236 08/15/2015   GLUCOSE 92 01/17/2015   CHOL 229 (H) 09/17/2015   TRIG 282.0 (H) 09/17/2015   HDL 57.80 09/17/2015   LDLDIRECT 125.0 09/17/2015   ALT 13 (L) 01/17/2015   AST 15 08/15/2015   NA 141 08/15/2015   K 4.3 08/15/2015   CL 104 01/17/2015   CREATININE 0.9 08/15/2015   BUN 11 08/15/2015   CO2 28 01/17/2015   HGBA1C 5.8 08/15/2015    Assessment & Plan:   Problem List Items Addressed This Visit    Nausea and vomiting    Currently improving. She has some Zofran at home. I advised her to continue to use it as needed.      Acute bronchitis - Primary    New problem History consistent with acute bronchitis. Given duration of symptoms, obtaining chest x-ray for further evaluation today.      Relevant Orders   DG Chest 2 View     Follow-up: PRN  [place]

## 2016-12-14 NOTE — Patient Instructions (Signed)
Zofran for nausea  Take your omeprazole.  We will call with the xray results.  Take care  Dr. Lacinda Axon

## 2016-12-14 NOTE — Assessment & Plan Note (Signed)
New problem History consistent with acute bronchitis. Given duration of symptoms, obtaining chest x-ray for further evaluation today.

## 2016-12-16 ENCOUNTER — Telehealth: Payer: Self-pay | Admitting: Family Medicine

## 2016-12-16 NOTE — Telephone Encounter (Signed)
Reason for call: shortness of breath , upper back still hurting  Symptoms: shortness of breath with exertion , upper still hurting, coughing, no fever, fatigued weak,  Duration  Medications: levofloxacin , Tylenol,  Last seen for this problem: 12/14/16 Seen by:  Dr Lacinda Axon  Please advise

## 2016-12-16 NOTE — Telephone Encounter (Signed)
Pt called wanted someone to call her about her visit she had with Dr. Lacinda Axon. It's about the antibiotics he prescribed to her. She is still shortness breathe, upper back still hurts. Please call her at [phone].

## 2016-12-17 NOTE — Telephone Encounter (Signed)
I would continue antibiotic. If she worsens, she needs to go to the hospital.

## 2016-12-17 NOTE — Telephone Encounter (Signed)
Spoke with patient advised of below states she is feeling better today.  Shortness of breath has improved , starting to feel better.

## 2016-12-23 ENCOUNTER — Encounter: Payer: Self-pay | Admitting: Obstetrics and Gynecology

## 2017-01-25 DIAGNOSIS — R1032 Left lower quadrant pain: Secondary | ICD-10-CM | POA: Diagnosis not present

## 2017-01-25 DIAGNOSIS — R1310 Dysphagia, unspecified: Secondary | ICD-10-CM | POA: Diagnosis not present

## 2017-01-25 DIAGNOSIS — R198 Other specified symptoms and signs involving the digestive system and abdomen: Secondary | ICD-10-CM | POA: Diagnosis not present

## 2017-01-25 DIAGNOSIS — R1013 Epigastric pain: Secondary | ICD-10-CM | POA: Diagnosis not present

## 2017-03-11 ENCOUNTER — Ambulatory Visit (INDEPENDENT_AMBULATORY_CARE_PROVIDER_SITE_OTHER): Payer: BLUE CROSS/BLUE SHIELD | Admitting: Family Medicine

## 2017-03-11 ENCOUNTER — Encounter: Payer: Self-pay | Admitting: Family Medicine

## 2017-03-11 DIAGNOSIS — N3001 Acute cystitis with hematuria: Secondary | ICD-10-CM | POA: Diagnosis not present

## 2017-03-11 DIAGNOSIS — N3941 Urge incontinence: Secondary | ICD-10-CM

## 2017-03-11 DIAGNOSIS — R300 Dysuria: Secondary | ICD-10-CM | POA: Diagnosis not present

## 2017-03-11 LAB — POCT URINALYSIS DIPSTICK
Bilirubin, UA: NEGATIVE
GLUCOSE UA: NEGATIVE
NITRITE UA: NEGATIVE
Spec Grav, UA: 1.02 (ref 1.010–1.025)
UROBILINOGEN UA: 0.2 U/dL
pH, UA: 7 (ref 5.0–8.0)

## 2017-03-11 MED ORDER — NITROFURANTOIN MONOHYD MACRO 100 MG PO CAPS: 100.0000 mg | ORAL_CAPSULE | Freq: Two times a day (BID) | ORAL | 0 refills | Status: DC

## 2017-03-11 NOTE — Assessment & Plan Note (Signed)
New problem. Patient wants to see urology. Arranging referral.

## 2017-03-11 NOTE — Progress Notes (Signed)
Subjective:  Patient ID: Suzanne Cruz, female    DOB: 1966/05/31  Age: 51 y.o. MRN: [id number]  CC: Dysuria, Urgency, incontinence  HPI:  51 year old female presents with the above complaints.  Patient reports a two-week history of worsening dysuria. Occurs at the beginning in and of the urinary stream. She reports urgency and frequency. She states that there are times when she has some incontinence if she can get to the restroom on time. No fever. No chills. She's had these symptoms previously but had negative urines. No hematuria. No known exacerbating or relieving factors. No other associated symptoms. No other complaints.  Social Hx   Social History   Social History  . Marital status: Single    Spouse name: N/A  . Number of children: N/A  . Years of education: N/A   Social History Main Topics  . Smoking status: Never Smoker  . Smokeless tobacco: Never Used  . Alcohol use 0.0 - 0.6 oz/week  . Drug use: No  . Sexual activity: Yes    Birth control/ protection: Post-menopausal   Other Topics Concern  . None   Social History Narrative  . None    Review of Systems  Constitutional: Negative.   Genitourinary: Positive for dysuria, frequency and urgency.   Objective:  BP 98/68 (BP Location: Right Arm, Patient Position: Sitting, Cuff Size: Small)   Pulse 94   Temp 98.1 F (36.7 C) (Oral)   Wt 127 lb 8 oz (57.8 kg)   SpO2 98%   BMI 20.89 kg/m   BP/Weight 03/11/2017 8/85/0277 11/28/2876  Systolic BP 98 676 720  Diastolic BP 68 76 64  Wt. (Lbs) 127.5 124.38 128.2  BMI 20.89 20.38 21.01    Physical Exam  Constitutional: She is oriented to person, place, and time. She appears well-developed. No distress.  Cardiovascular: Normal rate and regular rhythm.   Pulmonary/Chest: Effort normal and breath sounds normal. She has no wheezes. She has no rales.  Abdominal: Soft. She exhibits no distension. There is no tenderness.  Neurological: She is alert and oriented to  person, place, and time.  Psychiatric: She has a normal mood and affect.  Vitals reviewed.   Lab Results  Component Value Date   WBC 5.9 08/15/2015   HGB 13.6 08/15/2015   HCT 41 08/15/2015   PLT 236 08/15/2015   GLUCOSE 92 01/17/2015   CHOL 229 (H) 09/17/2015   TRIG 282.0 (H) 09/17/2015   HDL 57.80 09/17/2015   LDLDIRECT 125.0 09/17/2015   ALT 13 (L) 01/17/2015   AST 15 08/15/2015   NA 141 08/15/2015   K 4.3 08/15/2015   CL 104 01/17/2015   CREATININE 0.9 08/15/2015   BUN 11 08/15/2015   CO2 28 01/17/2015   HGBA1C 5.8 08/15/2015   Results for orders placed or performed in visit on 03/11/17 (from the past 24 hour(s))  POCT Urinalysis Dipstick     Status: Abnormal   Collection Time: 03/11/17 11:30 AM  Result Value Ref Range   Color, UA yellow    Clarity, UA clear    Glucose, UA negative    Bilirubin, UA negative    Ketones, UA trace    Spec Grav, UA 1.020 1.010 - 1.025   Blood, UA small    pH, UA 7.0 5.0 - 8.0   Protein, UA trace    Urobilinogen, UA 0.2 0.2 or 1.0 E.U./dL   Nitrite, UA negative    Leukocytes, UA Large (3+) (A) Negative   Assessment & Plan:  Problem List Items Addressed This Visit      Genitourinary   Acute cystitis with hematuria    New problem. Urinalysis with large leukocytes and small blood. Given symptoms, this is consistent with UTI. Obtaining culture. Starting on nitrofurantoin.      Relevant Orders   POCT Urinalysis Dipstick (Completed)   Urine Culture     Other   Urge incontinence    New problem. Patient wants to see urology. Arranging referral.      Relevant Medications   nitrofurantoin, macrocrystal-monohydrate, (MACROBID) 100 MG capsule   Other Relevant Orders   Ambulatory referral to Urology      Meds ordered this encounter  Medications  . nitrofurantoin, macrocrystal-monohydrate, (MACROBID) 100 MG capsule    Sig: Take 1 capsule (100 mg total) by mouth 2 (two) times daily.    Dispense:  10 capsule    Refill:  0      Follow-up: PRN  [place]

## 2017-03-11 NOTE — Assessment & Plan Note (Signed)
New problem. Urinalysis with large leukocytes and small blood. Given symptoms, this is consistent with UTI. Obtaining culture. Starting on nitrofurantoin.

## 2017-03-11 NOTE — Patient Instructions (Signed)
Antibiotic as prescribed.  We will call regarding the urology referral.  Take care  Dr. Lacinda Axon

## 2017-03-13 LAB — URINE CULTURE

## 2017-03-14 ENCOUNTER — Other Ambulatory Visit: Payer: Self-pay | Admitting: Family Medicine

## 2017-03-14 MED ORDER — AMOXICILLIN-POT CLAVULANATE 500-125 MG PO TABS
1.0000 | ORAL_TABLET | Freq: Two times a day (BID) | ORAL | 0 refills | Status: DC
Start: 2017-03-14 — End: 2017-06-09

## 2017-03-28 NOTE — Progress Notes (Signed)
03/29/2017 4:26 PM   Evern Bio [id number]  Referring provider: Coral Spikes, DO [address] [place], [place] [address]  Chief Complaint  Patient presents with  . New Patient (Initial Visit)    HPI: Patient is a 51 -year-old Caucasian female who is referred to [place] by, Dr. Thersa Salt, for urinary incontinence.  Patient states that she has had urinary incontinence for two weeks.  She has not had UI for the last two days.   Patient has incontinence with UI.   She is experiencing once incontinent episodes during the day. She is experiencing no incontinent episodes during the night.  Her incontinence volume is small.   Two weeks ago she was experiencing bladder pain, dysuria at the start and end of the stream and couldn't hold it.   She denies gross hematuria    She is not having associated urinary frequency, urgency, dysuria, nocturia, intermittency, hesitancy, straining to urinate and weak urinary stream at this time.    She recently was treated for a positive urine culture for Klebsiella.  She does not have a history of urinary tract infections, STI's or injury to the bladder.   She denies dysuria, suprapubic pain, back pain, abdominal pain or flank pain.  She has not had any recent fevers, chills, nausea or vomiting.   She does not have a history of nephrolithiasis, GU surgery or GU trauma.   She is sexually active.  She thinks that her UTI was precipitated by sexual activity.    She is post menopausal.   She admits to constipation and/or diarrhea.   She is not having pain with bladder filling.    Contrast CT in 2017 did not demonstrate any abnormalities with the kidneys or bladder.  UA today was unremarkable.     PMH: Past Medical History:  Diagnosis Date  . Chicken pox   . Colon polyps 2015  . Depression   . Endometriosis   . GERD (gastroesophageal reflux disease)   . History of kidney stones   . History of stomach ulcers   . IBS  (irritable bowel syndrome)   . Migraines     Surgical History: Past Surgical History:  Procedure Laterality Date  . ABDOMINAL HYSTERECTOMY  2001   partial only right ovary is the only thing she as.  Marland Kitchen BREAST SURGERY Left 2005   biopsy  . BUNIONECTOMY Left 2004  . ESOPHAGOGASTRODUODENOSCOPY (EGD) WITH PROPOFOL N/A 02/10/2015   Procedure: ESOPHAGOGASTRODUODENOSCOPY (EGD) WITH PROPOFOL;  Surgeon: [name], MD;  [facility];  Service: Endoscopy;  Laterality: N/A;  . LITHOTRIPSY  1993    Home Medications:  Allergies as of 03/29/2017      Reactions   Sulfa Antibiotics Rash      Medication List       Accurate as of 03/29/17  4:26 PM. Always use your most recent med list.          amoxicillin-clavulanate 500-125 MG tablet Commonly known as:  AUGMENTIN Take 1 tablet (500 mg total) by mouth 2 (two) times daily.   amphetamine-dextroamphetamine 30 MG 24 hr capsule Commonly known as:  ADDERALL XR Take 1 capsule (30 mg total) by mouth daily.   cetirizine 10 MG tablet Commonly known as:  ZYRTEC Take 10 mg by mouth daily as needed for allergies.   chlorhexidine 0.12 % solution Commonly known as:  PERIDEX RINSE MOUTH WITH 1/2 OZ (15 ML) TWICE A DAY FOR 30 SECONDS AND SPIT  cyanocobalamin 2000 MCG tablet Take 2,000 mcg by mouth daily.   cyclobenzaprine 5 MG tablet Commonly known as:  FLEXERIL Take 1 tablet (5 mg total) by mouth 3 (three) times daily as needed for muscle spasms.   DIGESTIVE ENZYMES PO Take by mouth.   FLUoxetine 40 MG capsule Commonly known as:  PROZAC Take 1 capsule (40 mg total) by mouth daily.   levofloxacin 500 MG tablet Commonly known as:  LEVAQUIN Take 1 tablet (500 mg total) by mouth daily.   lidocaine-prilocaine cream Commonly known as:  EMLA Apply 1 application topically at bedtime. Reported on 09/17/2015   lubiprostone 8 MCG capsule Commonly known as:  AMITIZA Take 8 mcg by mouth 2 (two) times daily as needed for  constipation.   omeprazole 40 MG capsule Commonly known as:  PRILOSEC Take 1 capsule (40 mg total) by mouth daily.   PROBIOTIC DAILY PO Take 1 tablet by mouth daily.   ranitidine 150 MG tablet Commonly known as:  ZANTAC Take 150 mg by mouth 2 (two) times daily.   sucralfate 1 g tablet Commonly known as:  CARAFATE Take 1 g by mouth 2 (two) times daily.       Allergies:  Allergies  Allergen Reactions  . Sulfa Antibiotics Rash    Family History: Family History  Problem Relation Age of Onset  . Sudden death Brother   . Hypertension Mother   . Hypertension Brother   . Stroke Paternal Grandmother     Social History:  reports that she has never smoked. She has never used smokeless tobacco. She reports that she drinks alcohol. She reports that she does not use drugs.  ROS: UROLOGY Frequent Urination?: Yes Hard to postpone urination?: Yes Burning/pain with urination?: No Get up at night to urinate?: No Leakage of urine?: Yes Urine stream starts and stops?: No Trouble starting stream?: No Do you have to strain to urinate?: No Blood in urine?: Yes Urinary tract infection?: Yes Sexually transmitted disease?: No Injury to kidneys or bladder?: No Painful intercourse?: No Weak stream?: No Currently pregnant?: No Vaginal bleeding?: No  Gastrointestinal Nausea?: No Vomiting?: No Indigestion/heartburn?: Yes Diarrhea?: Yes Constipation?: Yes  Constitutional Fever: No Night sweats?: No Weight loss?: No Fatigue?: Yes  Skin Skin rash/lesions?: No Itching?: No  Eyes Blurred vision?: No Double vision?: No  Ears/Nose/Throat Sore throat?: No Sinus problems?: No  Hematologic/Lymphatic Swollen glands?: No Easy bruising?: No  Cardiovascular Leg swelling?: No Chest pain?: No  Respiratory Cough?: No Shortness of breath?: No  Endocrine Excessive thirst?: No  Musculoskeletal Back pain?: Yes Joint pain?: Yes  Neurological Headaches?: No Dizziness?:  No  Psychologic Depression?: Yes Anxiety?: Yes  Physical Exam: BP 117/77 (BP Location: Left Arm, Patient Position: Sitting, Cuff Size: Normal)   Pulse 88   Ht 5\' 6"  (1.676 m)   Wt 132 lb 3.2 oz (60 kg)   BMI 21.34 kg/m   Constitutional: Well nourished. Alert and oriented, No acute distress. HEENT: [place] AT, moist mucus membranes. Trachea midline, no masses. Cardiovascular: No clubbing, cyanosis, or edema. Respiratory: Normal respiratory effort, no increased work of breathing. GI: Abdomen is soft, non tender, non distended, no abdominal masses. Liver and spleen not palpable.  No hernias appreciated.  Stool sample for occult testing is not indicated.   GU: No CVA tenderness.  No bladder fullness or masses.   Skin: No rashes, bruises or suspicious lesions. Lymph: No cervical or inguinal adenopathy. Neurologic: Grossly intact, no focal deficits, moving all 4 extremities. Psychiatric: Normal mood  and affect.  Laboratory Data:  Urinalysis Unremarkable.  See EPIC.    I have reviewed the labs.   Pertinent Imaging: CLINICAL DATA:  Bilateral lower abdominal and pelvic pain for 3 weeks. Diarrhea and constipation.  EXAM: CT ABDOMEN AND PELVIS WITH CONTRAST  TECHNIQUE: Multidetector CT imaging of the abdomen and pelvis was performed using the standard protocol following bolus administration of intravenous contrast.  CONTRAST:  100 mL Isovue-300  COMPARISON:  None.  FINDINGS: The lung bases are clear.  The liver, spleen, gallbladder, pancreas, adrenal glands, kidneys, abdominal aorta, inferior vena cava, and retroperitoneal lymph nodes are unremarkable. Stomach and small bowel are decompressed. Contrast material fills the colon. No colonic wall thickening or focal colonic lesion identified. Scattered colonic diverticula. No free air or free fluid in the abdomen. Abdominal wall musculature appears intact.  Pelvis: Bladder is decompressed. Uterus appears surgically  absent. Prominence of the right ovary with probable right ovarian cyst measuring 2.5 cm diameter. This is likely functional. No pelvic lymphadenopathy. No evidence of diverticulitis. Appendix is not demonstrated. No destructive bone lesions.  IMPRESSION: No acute process is demonstrated in the abdomen or pelvis that might account for her head and pelvic pain. No evidence of bowel obstruction or inflammation. Probable functional right ovarian cyst.   Electronically Signed   By: Lucienne Capers M.D.   On: 11/20/2015 18:21  I have independently reviewed the films.    Assessment & Plan:    1. Urge Incontinence  - may be due to the residual irritation from her UTI as it has not been present for the last two days  - patient would like to post pone any further evaluation or treatment at this time  - see will call if her incontinence returns   Return if symptoms worsen or fail to improve.  These notes generated with voice recognition software. I apologize for typographical errors.  Zara Council, [facility] [address], [place], [place] [address] [phone]

## 2017-03-29 ENCOUNTER — Encounter: Payer: Self-pay | Admitting: Urology

## 2017-03-29 ENCOUNTER — Ambulatory Visit: Payer: BLUE CROSS/BLUE SHIELD | Admitting: Urology

## 2017-03-29 VITALS — BP 117/77 | HR 88 | Ht 66.0 in | Wt 132.2 lb

## 2017-03-29 DIAGNOSIS — N3941 Urge incontinence: Secondary | ICD-10-CM

## 2017-03-29 LAB — URINALYSIS, COMPLETE
Bilirubin, UA: NEGATIVE
Glucose, UA: NEGATIVE
Ketones, UA: NEGATIVE
Leukocytes, UA: NEGATIVE
Nitrite, UA: NEGATIVE
PH UA: 6.5 (ref 5.0–7.5)
PROTEIN UA: NEGATIVE
Specific Gravity, UA: <1.005 — ABNORMAL LOW (ref 1.005–1.030)
UUROB: 0.2 mg/dL (ref 0.2–1.0)

## 2017-03-29 LAB — BLADDER SCAN AMB NON-IMAGING: Scan Result: 23

## 2017-03-29 LAB — MICROSCOPIC EXAMINATION

## 2017-04-08 ENCOUNTER — Other Ambulatory Visit: Payer: Self-pay | Admitting: Family Medicine

## 2017-04-08 NOTE — Telephone Encounter (Signed)
Pt called requesting a refill on her amphetamine-dextroamphetamine (ADDERALL XR) 30 MG 24 hr capsule. Please advise, thank you!  Call pt @ [phone]

## 2017-04-08 NOTE — Telephone Encounter (Signed)
Last office visit 03/11/17 acute ,04/09/16 chronic to follow up 3 months  No office visit scheduled

## 2017-04-11 MED ORDER — AMPHETAMINE-DEXTROAMPHET ER 30 MG PO CP24: 30.0000 mg | ORAL_CAPSULE | Freq: Every day | ORAL | 0 refills | Status: DC

## 2017-04-11 NOTE — Telephone Encounter (Signed)
Script placed up front for pick up

## 2017-04-19 ENCOUNTER — Other Ambulatory Visit: Payer: Self-pay | Admitting: Family Medicine

## 2017-04-20 NOTE — Telephone Encounter (Signed)
Last office visit 03/11/17 acute  Diclofenac not on med list d/c

## 2017-05-06 ENCOUNTER — Telehealth: Payer: Self-pay | Admitting: Urology

## 2017-05-06 ENCOUNTER — Telehealth: Payer: Self-pay | Admitting: Family Medicine

## 2017-05-06 ENCOUNTER — Ambulatory Visit (INDEPENDENT_AMBULATORY_CARE_PROVIDER_SITE_OTHER): Payer: BLUE CROSS/BLUE SHIELD

## 2017-05-06 VITALS — BP 126/75 | HR 94 | Ht 66.0 in | Wt 130.6 lb

## 2017-05-06 DIAGNOSIS — R300 Dysuria: Secondary | ICD-10-CM | POA: Diagnosis not present

## 2017-05-06 LAB — MICROSCOPIC EXAMINATION
RBC MICROSCOPIC, UA: NONE SEEN /HPF (ref 0–?)
WBC UA: NONE SEEN /HPF (ref 0–?)

## 2017-05-06 LAB — URINALYSIS, COMPLETE
BILIRUBIN UA: NEGATIVE
Glucose, UA: NEGATIVE
KETONES UA: NEGATIVE
Leukocytes, UA: NEGATIVE
NITRITE UA: NEGATIVE
Protein, UA: NEGATIVE
UUROB: 0.2 mg/dL (ref 0.2–1.0)
pH, UA: 6 (ref 5.0–7.5)

## 2017-05-06 NOTE — Progress Notes (Signed)
Pt presents today with c/o urinary frequency, hard to postpone urination, dysuria, back pain, and lower abd pain. A clean catch was obtained for u/a and cx.  Blood pressure 126/75, pulse 94, height 5\' 6"  (1.676 m), weight 130 lb 9.6 oz (59.2 kg).

## 2017-05-06 NOTE — Telephone Encounter (Signed)
Patient is coming in today on the nurse schedule for possible UTI. She has burning with urination and pain in her lower area.   Thanks, Sharyn Lull

## 2017-05-06 NOTE — Telephone Encounter (Signed)
Immunization record printed and placed up front  

## 2017-05-06 NOTE — Telephone Encounter (Signed)
Pt called and is requesting a copy of her immunizations. Please advise, thank you!  Call pt @ [phone]

## 2017-05-09 ENCOUNTER — Telehealth: Payer: Self-pay | Admitting: Urology

## 2017-05-09 NOTE — Telephone Encounter (Signed)
Pt called office asking for her urine results from Friday 05/06/17.  Please advise. Thanks .

## 2017-05-09 NOTE — Telephone Encounter (Signed)
Patient was told that we are waiting on results to finalize, only preliminary are back as of now

## 2017-05-10 LAB — CULTURE, URINE COMPREHENSIVE

## 2017-05-13 ENCOUNTER — Telehealth: Payer: Self-pay

## 2017-05-13 NOTE — Telephone Encounter (Signed)
Patient notfied

## 2017-05-13 NOTE — Telephone Encounter (Signed)
-----   Message from Nickie Retort, MD sent at 05/12/2017  6:39 PM EDT ----- Please let patient know her culture did not show a UTI. Thanks, BB ----- Message ----- From: Royanne Foots, CMA Sent: 05/11/2017  10:10 AM To: Nickie Retort, MD    ----- Message ----- From: Interface, [facility] Lab Results In Sent: 05/06/2017   4:39 PM To: Lestine Box, LPN

## 2017-06-08 ENCOUNTER — Telehealth: Payer: Self-pay

## 2017-06-08 ENCOUNTER — Ambulatory Visit: Payer: BLUE CROSS/BLUE SHIELD

## 2017-06-08 VITALS — BP 113/68 | HR 89 | Ht 66.0 in | Wt 128.0 lb

## 2017-06-08 DIAGNOSIS — R300 Dysuria: Secondary | ICD-10-CM | POA: Diagnosis not present

## 2017-06-08 DIAGNOSIS — H16403 Unspecified corneal neovascularization, bilateral: Secondary | ICD-10-CM | POA: Diagnosis not present

## 2017-06-08 DIAGNOSIS — H5211 Myopia, right eye: Secondary | ICD-10-CM | POA: Diagnosis not present

## 2017-06-08 DIAGNOSIS — H1789 Other corneal scars and opacities: Secondary | ICD-10-CM | POA: Diagnosis not present

## 2017-06-08 DIAGNOSIS — H18213 Corneal edema secondary to contact lens, bilateral: Secondary | ICD-10-CM | POA: Diagnosis not present

## 2017-06-08 LAB — URINALYSIS, COMPLETE
BILIRUBIN UA: NEGATIVE
Glucose, UA: NEGATIVE
KETONES UA: NEGATIVE
LEUKOCYTES UA: NEGATIVE
NITRITE UA: NEGATIVE
Protein, UA: NEGATIVE
SPEC GRAV UA: 1.02 (ref 1.005–1.030)
Urobilinogen, Ur: 0.2 mg/dL (ref 0.2–1.0)
pH, UA: 6 (ref 5.0–7.5)

## 2017-06-08 LAB — MICROSCOPIC EXAMINATION
RBC, UA: NONE SEEN /hpf (ref 0–?)
WBC, UA: NONE SEEN /hpf (ref 0–?)

## 2017-06-08 MED ORDER — NITROFURANTOIN MONOHYD MACRO 100 MG PO CAPS: 100.0000 mg | ORAL_CAPSULE | Freq: Two times a day (BID) | ORAL | 0 refills | Status: DC

## 2017-06-08 NOTE — Telephone Encounter (Signed)
Noted  

## 2017-06-08 NOTE — Telephone Encounter (Signed)
Noted. We'll plan on seeing her tomorrow given that she has seen the eye doctor already. If she has any acute changes she should be evaluated prior to seeing [place].

## 2017-06-08 NOTE — Progress Notes (Addendum)
Pt presents today with c/o urinary frequency, hard to postpone urination, dysuria, back pain, lower abd pain, and nausea. Pt stated that she took macrobid yesterday and her s/s improved. A clean catch was obtained for u/a and cx.  Blood pressure 113/68, pulse 89, height 5\' 6"  (1.676 m), weight 128 lb (58.1 kg).  Per [place] macrobid bid x7days was given to pt. Made pt aware abx were sent to pharmacy. Pt voiced understanding.

## 2017-06-08 NOTE — Telephone Encounter (Signed)
Patient is on schedule for drastic vision change, please triage per Dr.Sonnenberg

## 2017-06-08 NOTE — Addendum Note (Signed)
Addended by: Toniann Fail C on: 06/08/2017 02:28 PM   Modules accepted: Orders

## 2017-06-08 NOTE — Telephone Encounter (Signed)
Pt went to eye dr due to vision changes. She has had drastic changes in the 10 months with eye sight. Optometrist thought maybe patient is diabetic but non fasting check with glucometer was in the 70's. She denies having any other symptoms. The only thing she can think of is she is more tired than usual.

## 2017-06-09 ENCOUNTER — Ambulatory Visit (INDEPENDENT_AMBULATORY_CARE_PROVIDER_SITE_OTHER): Payer: BLUE CROSS/BLUE SHIELD | Admitting: Family Medicine

## 2017-06-09 ENCOUNTER — Encounter: Payer: Self-pay | Admitting: Family Medicine

## 2017-06-09 VITALS — BP 110/60 | HR 79 | Temp 98.6°F | Wt 130.2 lb

## 2017-06-09 DIAGNOSIS — H539 Unspecified visual disturbance: Secondary | ICD-10-CM | POA: Diagnosis not present

## 2017-06-09 DIAGNOSIS — F419 Anxiety disorder, unspecified: Secondary | ICD-10-CM

## 2017-06-09 DIAGNOSIS — F909 Attention-deficit hyperactivity disorder, unspecified type: Secondary | ICD-10-CM | POA: Diagnosis not present

## 2017-06-09 LAB — HEMOGLOBIN A1C: Hgb A1c MFr Bld: 5.8 % (ref 4.6–6.5)

## 2017-06-09 NOTE — Progress Notes (Signed)
  Tommi Rumps, MD Phone: [phone]  Suzanne Cruz is a 51 y.o. female who presents today for f/u.  Vision changes: Patient notes progressively over a number of months she's had some vision changes most noticeably at work up close. She was evaluated by her optometrist and they felt like her prescription changed enough that it was worrisome for diabetes. Patient checked a nonfasting CBG yesterday and it was 70. She was advised by the optometrist that she had scarring and that she could not wear her contacts anymore. They prescribed prednisone eyedrops and lubricating eyedrops. She's going to see them back in 1 week. She does stay thirsty though no polyuria.  ADHD: Currently taking Adderall. Only takes it while at work. No weight changes. No appetite changes.  Patient does report anxiety issues. Notes she does pretty well when she takes the Prozac. No depression. She noted slight increased anxiety when she started her new job. When she gets anxious she feels an occasional flutter in her chest though otherwise does not have any symptoms at other times. No chest pain or trouble breathing.  PMH: nonsmoker.   ROS see history of present illness  Objective  Physical Exam Vitals:   06/09/17 1018  BP: 110/60  Pulse: 79  Temp: 98.6 F (37 C)  SpO2: 97%    BP Readings from Last 3 Encounters:  06/09/17 110/60  06/08/17 113/68  05/06/17 126/75   Wt Readings from Last 3 Encounters:  06/09/17 130 lb 3.2 oz (59.1 kg)  06/08/17 128 lb (58.1 kg)  05/06/17 130 lb 9.6 oz (59.2 kg)    Physical Exam  Constitutional: No distress.  Eyes: Pupils are equal, round, and reactive to light. Conjunctivae are normal.  No gross corneal changes noted  Cardiovascular: Normal rate, regular rhythm and normal heart sounds.   Pulmonary/Chest: Effort normal and breath sounds normal.  Neurological: She is alert. Gait normal.  Skin: Skin is warm and dry. She is not diaphoretic.     Assessment/Plan:  Please see individual problem list.  Vision changes Has already been evaluated by optometry. They suggested that she may have diabetes given the change in her vision. I would find this unlikely given her nonfasting CBG of 70. We'll check an A1c today. I suspect her vision changes are more from the scarring that they advised that she had. She will follow up with them as planned.  Adult ADHD Doing well on Adderall. She'll let us know when she needs refills. Follow-up in 3 months.  Anxiety Fairly well controlled currently. Had some anxiety when starting her new job. Does not have any fluttering sensation at any other times other than when she is anxious. She'll monitor her anxiety.   Orders Placed This Encounter  Procedures  . HgB A1c   Tommi Rumps, MD [place].

## 2017-06-09 NOTE — Patient Instructions (Signed)
Nice to see you. We'll check an A1c today. Please follow up with your optometrist regarding your eyes as planned. Please monitor your anxiety and if it worsens please let us know. If you feel like your heart is racing please let us know as well.

## 2017-06-09 NOTE — Assessment & Plan Note (Signed)
Has already been evaluated by optometry. They suggested that she may have diabetes given the change in her vision. I would find this unlikely given her nonfasting CBG of 70. We'll check an A1c today. I suspect her vision changes are more from the scarring that they advised that she had. She will follow up with them as planned.

## 2017-06-09 NOTE — Assessment & Plan Note (Signed)
Fairly well controlled currently. Had some anxiety when starting her new job. Does not have any fluttering sensation at any other times other than when she is anxious. She'll monitor her anxiety.

## 2017-06-09 NOTE — Assessment & Plan Note (Signed)
Doing well on Adderall. She'll let us know when she needs refills. Follow-up in 3 months.

## 2017-06-13 DIAGNOSIS — H18213 Corneal edema secondary to contact lens, bilateral: Secondary | ICD-10-CM | POA: Diagnosis not present

## 2017-06-13 DIAGNOSIS — H1789 Other corneal scars and opacities: Secondary | ICD-10-CM | POA: Diagnosis not present

## 2017-06-14 LAB — CULTURE, URINE COMPREHENSIVE

## 2017-06-15 ENCOUNTER — Telehealth: Payer: Self-pay

## 2017-06-15 NOTE — Telephone Encounter (Signed)
-----   Message from Nori Riis, PA-C sent at 06/14/2017  8:09 PM EDT ----- Please let Mrs. Joelyn Oms know that the bacteria was only intermediately susceptible to the [place].  If she feels she is not improving, we can change the antibiotic.

## 2017-06-15 NOTE — Telephone Encounter (Signed)
We can start her on Cipro 500 mg, 1 tablet twice a day for 7 days.

## 2017-06-15 NOTE — Telephone Encounter (Signed)
Spoke with pt in reference to ucx results and macrobid. Pt stated that she is still having symptoms. Pt inquired about being on another abx.

## 2017-06-16 MED ORDER — CIPROFLOXACIN HCL 500 MG PO TABS: 500.0000 mg | ORAL_TABLET | Freq: Two times a day (BID) | ORAL | 0 refills | Status: DC

## 2017-06-16 NOTE — Telephone Encounter (Signed)
Spoke to pt gave results. Patient verbalized understanding.

## 2017-06-22 DIAGNOSIS — H1789 Other corneal scars and opacities: Secondary | ICD-10-CM | POA: Diagnosis not present

## 2017-06-22 DIAGNOSIS — H02822 Cysts of right lower eyelid: Secondary | ICD-10-CM | POA: Diagnosis not present

## 2017-06-22 DIAGNOSIS — H18213 Corneal edema secondary to contact lens, bilateral: Secondary | ICD-10-CM | POA: Diagnosis not present

## 2017-07-12 DIAGNOSIS — H18213 Corneal edema secondary to contact lens, bilateral: Secondary | ICD-10-CM | POA: Diagnosis not present

## 2017-07-12 DIAGNOSIS — H16403 Unspecified corneal neovascularization, bilateral: Secondary | ICD-10-CM | POA: Diagnosis not present

## 2017-07-12 DIAGNOSIS — H1789 Other corneal scars and opacities: Secondary | ICD-10-CM | POA: Diagnosis not present

## 2017-08-26 ENCOUNTER — Telehealth: Payer: Self-pay | Admitting: Family Medicine

## 2017-08-26 MED ORDER — AMPHETAMINE-DEXTROAMPHET ER 30 MG PO CP24: 30.0000 mg | ORAL_CAPSULE | Freq: Every day | ORAL | 0 refills | Status: DC

## 2017-08-26 NOTE — Telephone Encounter (Signed)
Patient notified and scheduled 

## 2017-08-26 NOTE — Telephone Encounter (Signed)
Copied from [place] [phone]. Topic: Quick Communication - See Telephone Encounter >> Aug 26, 2017  2:14 PM Oneta Rack wrote: CRM for notification. See Telephone encounter for:   08/26/17.  Relation to pt: self Call back number: [phone] Pharmacy: [id number] - [place], [place] [phone] ([phone] (Fax)    Reason for call:  Patient requesting amphetamine-dextroamphetamine (ADDERALL XR) 30 MG 24 hr capsule , diclofenac (VOLTAREN) 75 MG EC tablet, cyclobenzaprine (FLEXERIL) 5 MG tablet, informed patient please allow 48 to 72 hour turn around time.

## 2017-08-26 NOTE — Telephone Encounter (Signed)
Refill given.  Patient does need follow-up scheduled.  Please place refill at front for pickup.

## 2017-09-01 DIAGNOSIS — H02823 Cysts of right eye, unspecified eyelid: Secondary | ICD-10-CM | POA: Diagnosis not present

## 2017-09-05 DIAGNOSIS — M542 Cervicalgia: Secondary | ICD-10-CM | POA: Diagnosis not present

## 2017-09-05 DIAGNOSIS — M9901 Segmental and somatic dysfunction of cervical region: Secondary | ICD-10-CM | POA: Diagnosis not present

## 2017-09-05 DIAGNOSIS — M530 Cervicocranial syndrome: Secondary | ICD-10-CM | POA: Diagnosis not present

## 2017-09-05 DIAGNOSIS — M9903 Segmental and somatic dysfunction of lumbar region: Secondary | ICD-10-CM | POA: Diagnosis not present

## 2017-09-09 DIAGNOSIS — M530 Cervicocranial syndrome: Secondary | ICD-10-CM | POA: Diagnosis not present

## 2017-09-09 DIAGNOSIS — M9901 Segmental and somatic dysfunction of cervical region: Secondary | ICD-10-CM | POA: Diagnosis not present

## 2017-09-09 DIAGNOSIS — M9903 Segmental and somatic dysfunction of lumbar region: Secondary | ICD-10-CM | POA: Diagnosis not present

## 2017-09-09 DIAGNOSIS — M542 Cervicalgia: Secondary | ICD-10-CM | POA: Diagnosis not present

## 2017-09-10 ENCOUNTER — Other Ambulatory Visit: Payer: Self-pay | Admitting: Family Medicine

## 2017-09-12 NOTE — Telephone Encounter (Signed)
Okay to refill? Last written by Dr. Lacinda Axon on: 04/21/17 #30 with no refills.  LOV: 06/09/17 (sonnenberg) NOV: 10/26/17 (sonnenberg)

## 2017-09-12 NOTE — Telephone Encounter (Signed)
Please find out what she needs these medications for.  Thanks.

## 2017-09-14 DIAGNOSIS — M530 Cervicocranial syndrome: Secondary | ICD-10-CM | POA: Diagnosis not present

## 2017-09-14 DIAGNOSIS — M542 Cervicalgia: Secondary | ICD-10-CM | POA: Diagnosis not present

## 2017-09-14 DIAGNOSIS — M9903 Segmental and somatic dysfunction of lumbar region: Secondary | ICD-10-CM | POA: Diagnosis not present

## 2017-09-14 DIAGNOSIS — M9901 Segmental and somatic dysfunction of cervical region: Secondary | ICD-10-CM | POA: Diagnosis not present

## 2017-09-16 NOTE — Telephone Encounter (Signed)
Tried calling,no vm. [place] for pec to speak to patient

## 2017-09-19 NOTE — Telephone Encounter (Signed)
Patient called back, states it for her neck and head pain

## 2017-09-22 NOTE — Telephone Encounter (Signed)
fyi

## 2017-09-22 NOTE — Telephone Encounter (Signed)
Refill given.  Need to discuss at next office visit.

## 2017-09-23 DIAGNOSIS — M542 Cervicalgia: Secondary | ICD-10-CM | POA: Diagnosis not present

## 2017-09-23 DIAGNOSIS — M9901 Segmental and somatic dysfunction of cervical region: Secondary | ICD-10-CM | POA: Diagnosis not present

## 2017-09-23 DIAGNOSIS — M9903 Segmental and somatic dysfunction of lumbar region: Secondary | ICD-10-CM | POA: Diagnosis not present

## 2017-09-23 DIAGNOSIS — M530 Cervicocranial syndrome: Secondary | ICD-10-CM | POA: Diagnosis not present

## 2017-10-07 DIAGNOSIS — M9903 Segmental and somatic dysfunction of lumbar region: Secondary | ICD-10-CM | POA: Diagnosis not present

## 2017-10-07 DIAGNOSIS — M9901 Segmental and somatic dysfunction of cervical region: Secondary | ICD-10-CM | POA: Diagnosis not present

## 2017-10-07 DIAGNOSIS — M542 Cervicalgia: Secondary | ICD-10-CM | POA: Diagnosis not present

## 2017-10-07 DIAGNOSIS — M530 Cervicocranial syndrome: Secondary | ICD-10-CM | POA: Diagnosis not present

## 2017-10-11 ENCOUNTER — Ambulatory Visit: Payer: Self-pay

## 2017-10-11 NOTE — Telephone Encounter (Signed)
It is okay to use the 15-minute slot on Friday.  Thanks.

## 2017-10-11 NOTE — Telephone Encounter (Signed)
Patient scheduled for Friday at 3:30

## 2017-10-11 NOTE — Telephone Encounter (Signed)
Left message for patient to return call to office. 

## 2017-10-11 NOTE — Telephone Encounter (Signed)
Pt calling with depression and anxiety that started 2 weeks ago. 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 Pt feels she did not get enough training, feels that coworkers are not doing their job. Pt states that it is affecting her appetite and is having trouble with concentration. Pt states she is fatigued. Denies suicidal or homicidal ideation. Attempted to get appointment and saw no openings. Per Kerin Salen she needs to be seen by her PCP Routing to office Reason for Disposition . Symptoms interfere with work or school  Answer Assessment - Initial Assessment Questions 1. CONCERN: "What happened that made you call today?"     anxiety 2. ANXIETY SYMPTOM SCREENING: "Can you describe how you have been feeling?"  (e.g., tense, restless, panicky, anxious, keyed up, trouble sleeping, trouble concentrating)     Panicky at work,anxious,trouble concentrating 3. ONSET: "How long have you been feeling this way?"     2 weeks  4. RECURRENT: "Have you felt this way before?"  If yes: "What happened that time?" "What helped these feelings go away in the past?"      Yes-medication- what helped was being out of the situation 5. RISK OF HARM - SUICIDAL IDEATION:  "Do you ever have thoughts of hurting or killing yourself?"  (e.g., yes, no, no but preoccupation with thoughts about death)   - INTENT:  "Do you have thoughts of hurting or killing yourself right NOW?" (e.g., yes, no, N/A)   - PLAN: "Do you have a specific plan for how you would do this?" (e.g., gun, knife, overdose, no plan, N/A)     no 6. RISK OF HARM - HOMICIDAL IDEATION:  "Do you ever have thoughts of hurting or killing someone else?"  (e.g., yes, no, no but preoccupation with thoughts about death)   - INTENT:  "Do you have thoughts of hurting or killing someone right NOW?" (e.g., yes, no, N/A)   - PLAN: "Do you have a specific plan for how you would do this?" (e.g., gun, knife, no plan, N/A)      no 7. FUNCTIONAL IMPAIRMENT: "How have things been  going for you overall in your life? Have you had any more difficulties than usual doing your normal daily activities?"  (e.g., better, same, worse; self-care, school, work, interactions)     Work is the stressor- poor appetite, fatigue 8. SUPPORT: "Who is with you now?" "Who do you live with?" "Do you have family or friends nearby who you can talk to?"      Daughter-yes  9. THERAPIST: "Do you have a counselor or therapist? Name?"     No  10. STRESSORS: "Has there been any new stress or recent changes in your life?"       New job and coworkers- feels like she didn't get enough orientation 11. CAFFEINE ABUSE: "Do you drink caffeinated beverages, and how much each day?" (e.g., coffee, tea, colas)       Drink coffee tea 12. SUBSTANCE ABUSE: "Do you use any illegal drugs or alcohol?"       no 13. OTHER SYMPTOMS: "Do you have any other physical symptoms right now?" (e.g., chest pain, palpitations, difficulty breathing, fever)       no 14. PREGNANCY: "Is there any chance you are pregnant?" "When was your last menstrual period?"       No s/p partial hysterectomy  Protocols used: ANXIETY AND PANIC ATTACK-A-AH

## 2017-10-14 ENCOUNTER — Encounter: Payer: Self-pay | Admitting: Family Medicine

## 2017-10-14 ENCOUNTER — Ambulatory Visit: Payer: BLUE CROSS/BLUE SHIELD | Admitting: Family Medicine

## 2017-10-14 ENCOUNTER — Other Ambulatory Visit: Payer: Self-pay

## 2017-10-14 VITALS — BP 120/90 | HR 98 | Temp 98.1°F | Wt 123.4 lb

## 2017-10-14 DIAGNOSIS — F329 Major depressive disorder, single episode, unspecified: Secondary | ICD-10-CM

## 2017-10-14 DIAGNOSIS — F419 Anxiety disorder, unspecified: Secondary | ICD-10-CM

## 2017-10-14 DIAGNOSIS — R079 Chest pain, unspecified: Secondary | ICD-10-CM | POA: Diagnosis not present

## 2017-10-14 DIAGNOSIS — F32A Depression, unspecified: Secondary | ICD-10-CM

## 2017-10-14 MED ORDER — ESCITALOPRAM OXALATE 10 MG PO TABS: 10.0000 mg | ORAL_TABLET | Freq: Every day | ORAL | 3 refills | Status: DC

## 2017-10-14 NOTE — Patient Instructions (Addendum)
Nice to see you. We will treat you with Lexapro for anxiety.  This may take 4-8 weeks to come into effect. Please consider if you want to see a therapist. Please provide [place] with any paperwork that needs to be done to keep you out of work. If you develop chest pain, shortness of breath, palpitations, or any new or changing symptoms please be evaluated.

## 2017-10-14 NOTE — Assessment & Plan Note (Addendum)
Depression is a new issue.  Patient with fairly significant anxiety and what sounds to be panic attacks.  Also with depression now.  No SI.  EKG today is reassuring with no ischemic issue.  PR interval is very minimally short.  Will forward to cardiology to review.  I suspect her symptoms are panic attacks and anxiety related.  We will start the patient on Lexapro.  Discussed staying out of work for the next week.  She is considering FMLA and turning in her notice for her job.  She will let us know if we need to help with FMLA paperwork.  She is given return precautions.

## 2017-10-14 NOTE — Progress Notes (Signed)
  Tommi Rumps, MD Phone: [phone]  Suzanne Cruz is a 52 y.o. female who presents today for follow-up.  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  She reports the employs do not want to do their job appropriately and this is difficult for her.  She has been having intermittent panic attacks where she feels like she is going to pass out with significant anxiety.  She does occasionally note some slight discomfort in her back and chest at times with these episodes.  No exertional symptoms.  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  She notes no shortness of breath.  She has not passed out.  She does note some depression.  She has not been sleeping well.  She has a lot of other stuff going on as she is planning a wedding as well.  She notes no SI.  She intermittently takes Prozac and has not taken it in the last week.  No family history of cardiac disease.  No personal history of cardiac disease.  No cardiac risk factors.  Social History   Tobacco Use  Smoking Status Never Smoker  Smokeless Tobacco Never Used     ROS see history of present illness  Objective  Physical Exam Vitals:   10/14/17 1526  BP: 120/90  Pulse: 98  Temp: 98.1 F (36.7 C)  SpO2: 98%    BP Readings from Last 3 Encounters:  10/14/17 120/90  06/09/17 110/60  06/08/17 113/68   Wt Readings from Last 3 Encounters:  10/14/17 123 lb 6.4 oz (56 kg)  06/09/17 130 lb 3.2 oz (59.1 kg)  06/08/17 128 lb (58.1 kg)    Physical Exam  Constitutional: No distress.  Cardiovascular: Normal rate, regular rhythm and normal heart sounds.  Pulmonary/Chest: Effort normal and breath sounds normal.  Musculoskeletal: She exhibits no edema.  Neurological: She is alert. Gait normal.  Skin: Skin is warm and dry. She is not diaphoretic.   EKG:  Normal sinus rhythm, rate 93, slightly short PR interval, negative precordial T waves similar to prior, no ischemic changes  Assessment/Plan: Please see individual problem list.  Anxiety and depression Depression is a new issue.  Patient with fairly significant anxiety and what sounds to be panic attacks.  Also with depression now.  No SI.  EKG today is reassuring with no ischemic issue.  PR interval is very minimally short.  Will forward to cardiology to review.  I suspect her symptoms are panic attacks and anxiety related.  We will start the patient on Lexapro.  Discussed staying out of work for the next week.  She is considering FMLA and turning in her notice for her job.  She will let us know if we need to help with FMLA paperwork.  She is given return precautions.   Orders Placed This Encounter  Procedures  . EKG 12-Lead    Meds ordered this encounter  Medications  . escitalopram (LEXAPRO) 10 MG tablet    Sig: Take 1 tablet (10 mg total) by mouth daily.    Dispense:  30 tablet    Refill:  [place], MD [place]

## 2017-10-18 ENCOUNTER — Telehealth: Payer: Self-pay | Admitting: Family Medicine

## 2017-10-18 NOTE — Telephone Encounter (Signed)
-----   Message from Nelva Bush, MD sent at 10/18/2017  6:46 AM EST ----- Regarding: RE: EKG question Hi Emmerie Battaglia,  There is a bit of artifact on the EKG, which I believe is making it challenging for the computer to accurately measure the PR interval. It looks normal to me. I also do not see a delta wave to suggest preexcitation. Let me know if any other questions or concerns come up.  Gerald Stabs  ----- Message ----- From: Leone Haven, MD Sent: 10/14/2017   5:16 PM To: Nelva Bush, MD Subject: EKG question                                   Hi Dr End,   I wanted to see if you could help me with a question. I saw this patient for anxiety today and based on some of her symptoms (feeling like she is going to pass out, heart racing, chest and back pain during some of her episodes) I obtained an EKG which was overall reassuring to me with no ischemic signs though did have a very mild shortening of the PR interval. I believe most likely her symptoms are anxiety/panic attack related and I am treating her for that, though wanted to see if you could look at the EKG and also let me know if her short PR interval is of concern. Thanks for your help.  Randall Hiss  ----- Message ----- From: Juanda Chance, CMA Sent: 10/14/2017   4:02 PM To: Leone Haven, MD

## 2017-10-18 NOTE — Telephone Encounter (Signed)
Cardiology responded with the below message regarding this patients EKG.

## 2017-10-26 ENCOUNTER — Ambulatory Visit: Payer: BLUE CROSS/BLUE SHIELD | Admitting: Family Medicine

## 2017-11-04 ENCOUNTER — Telehealth: Payer: Self-pay | Admitting: *Deleted

## 2017-11-04 NOTE — Telephone Encounter (Signed)
Please contact the patient and offer her 11:30 on Monday. Please also let he know that it could take up to 8 weeks for the lexapro to become effective. Thanks.

## 2017-11-04 NOTE — Telephone Encounter (Signed)
Copied from [place]. Topic: General - Other >> Nov 04, 2017  3:30 PM Carolyn Stare wrote:  Pt cal lto say her anxiety medicine is not working and would like am appt but nothing is available would like a call back   [phone]

## 2017-11-04 NOTE — Telephone Encounter (Signed)
Patient notified and scheduled 

## 2017-11-04 NOTE — Telephone Encounter (Signed)
Please advise 

## 2017-11-08 ENCOUNTER — Ambulatory Visit: Payer: BLUE CROSS/BLUE SHIELD | Admitting: Family Medicine

## 2017-11-08 ENCOUNTER — Encounter: Payer: Self-pay | Admitting: Family Medicine

## 2017-11-08 DIAGNOSIS — F419 Anxiety disorder, unspecified: Secondary | ICD-10-CM

## 2017-11-08 DIAGNOSIS — F32A Depression, unspecified: Secondary | ICD-10-CM

## 2017-11-08 DIAGNOSIS — F329 Major depressive disorder, single episode, unspecified: Secondary | ICD-10-CM

## 2017-11-08 NOTE — Assessment & Plan Note (Addendum)
Fairly significant anxiety and depression.  I discussed that it can take up to 2 months for the Lexapro to really start to work.  She will stay on her current dosing.  I think her symptoms will improve after she is out of work and I will provide her with a work note to be out of work to help with her mental health.  She will contact us in 1 month if not improving and we could consider increasing the Lexapro.  She will follow-up in 3 months.  Given return precautions.  Hot flashes could be related to medication versus perimenopause.  She will monitor these.

## 2017-11-08 NOTE — Progress Notes (Signed)
  Tommi Rumps, MD Phone: [phone]  Suzanne Cruz is a 52 y.o. female who presents today for f/u.   Presents for follow-up of anxiety and depression.  She notes it has actually gotten a little bit worse.  I wrote her out of work for a few days after her last visit though she went right back to work as they talked her into coming in. This exacerbated things quite a bit. She notes she leaves work in tears relatively frequently.  Has significant anxiety.  Quite a bit of depression as well.  She has been on the Lexapro though has only been on it for a little less than a month.  She has noted to feel hot flashes though otherwise no adverse effects.  She notes no SI.  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 She reports she has turned in her notice for work.   GAD 7 : Generalized Anxiety Score 11/08/2017  Nervous, Anxious, on Edge 3  Control/stop worrying 3  Worry too much - different things 3  Trouble relaxing 2  Restless 2  Easily annoyed or irritable 3  Afraid - awful might happen 2  Total GAD 7 Score 18   Depression screen PHQ 2/9 11/08/2017  Decreased Interest 2  Down, Depressed, Hopeless 2  PHQ - 2 Score 4  Altered sleeping 3  Tired, decreased energy 2  Change in appetite 2  Feeling bad or failure about yourself  3  Trouble concentrating 2  Moving slowly or fidgety/restless 2  Suicidal thoughts 0  PHQ-9 Score 18  Difficult doing work/chores Extremely dIfficult     Social History   Tobacco Use  Smoking Status Never Smoker  Smokeless Tobacco Never Used     ROS see history of present illness  Objective  Physical Exam Vitals:   11/08/17 1134  BP: 112/70  Pulse: 89  Temp: 98.4 F (36.9 C)  SpO2: 97%    BP Readings from Last 3 Encounters:  11/08/17 112/70  10/14/17 120/90  06/09/17 110/60   Wt Readings from Last 3 Encounters:  11/08/17 127 lb 9.6 oz (57.9 kg)  10/14/17 123 lb 6.4 oz (56 kg)    06/09/17 130 lb 3.2 oz (59.1 kg)    Physical Exam  Constitutional: She is well-developed, well-nourished, and in no distress.  Cardiovascular: Normal rate, regular rhythm and normal heart sounds.  Pulmonary/Chest: Effort normal and breath sounds normal.     Assessment/Plan: Please see individual problem list.  Anxiety and depression Fairly significant anxiety and depression.  I discussed that it can take up to 2 months for the Lexapro to really start to work.  She will stay on her current dosing.  I think her symptoms will improve after she is out of work and I will provide her with a work note to be out of work to help with her mental health.  She will contact us in 1 month if not improving and we could consider increasing the Lexapro.  She will follow-up in 3 months.  Given return precautions.  Hot flashes could be related to medication versus perimenopause.  She will monitor these.   No orders of the defined types were placed in this encounter.   No orders of the defined types were placed in this encounter.    Tommi Rumps, MD [place]

## 2017-11-08 NOTE — Patient Instructions (Signed)
Nice to see you. Please continue the Lexapro. I provided you with a work note.  If you need to do FMLA please let us know and we can help with any paperwork that is needed. If you develop thoughts of harming yourself please go to the emergency room. If your anxiety and depression is not improving after the next month please contact us and we could increase the Lexapro if needed.

## 2017-11-18 ENCOUNTER — Ambulatory Visit: Payer: BLUE CROSS/BLUE SHIELD | Admitting: Family Medicine

## 2017-12-06 DIAGNOSIS — I8002 Phlebitis and thrombophlebitis of superficial vessels of left lower extremity: Secondary | ICD-10-CM | POA: Diagnosis not present

## 2017-12-06 DIAGNOSIS — M79605 Pain in left leg: Secondary | ICD-10-CM | POA: Diagnosis not present

## 2017-12-06 DIAGNOSIS — R2243 Localized swelling, mass and lump, lower limb, bilateral: Secondary | ICD-10-CM | POA: Diagnosis not present

## 2017-12-06 DIAGNOSIS — I872 Venous insufficiency (chronic) (peripheral): Secondary | ICD-10-CM | POA: Diagnosis not present

## 2017-12-20 DIAGNOSIS — M4310 Spondylolisthesis, site unspecified: Secondary | ICD-10-CM | POA: Diagnosis not present

## 2017-12-20 DIAGNOSIS — M5412 Radiculopathy, cervical region: Secondary | ICD-10-CM | POA: Diagnosis not present

## 2017-12-28 ENCOUNTER — Other Ambulatory Visit: Payer: Self-pay | Admitting: Family Medicine

## 2017-12-28 DIAGNOSIS — M9901 Segmental and somatic dysfunction of cervical region: Secondary | ICD-10-CM | POA: Diagnosis not present

## 2017-12-28 DIAGNOSIS — M530 Cervicocranial syndrome: Secondary | ICD-10-CM | POA: Diagnosis not present

## 2017-12-28 DIAGNOSIS — M9903 Segmental and somatic dysfunction of lumbar region: Secondary | ICD-10-CM | POA: Diagnosis not present

## 2017-12-28 DIAGNOSIS — M542 Cervicalgia: Secondary | ICD-10-CM | POA: Diagnosis not present

## 2017-12-28 NOTE — Telephone Encounter (Signed)
Per last office note patient was told to contact office if medication is not working, the note mentioned Lexapro but not the adderall. Please advise

## 2017-12-28 NOTE — Telephone Encounter (Signed)
We did not discuss her ADHD at her last visit.  She would need follow-up in the office to discuss this further to consider increasing the dose or changing her regimen.  Please offer her the next available appointment.

## 2017-12-28 NOTE — Telephone Encounter (Signed)
Please advise 

## 2017-12-28 NOTE — Telephone Encounter (Signed)
Copied from [place]. Topic: Quick Communication - See Telephone Encounter >> Dec 28, 2017 10:13 AM Ivar Drape wrote: CRM for notification. See Telephone encounter for: 12/28/17. Patient would like her amphetamine-dextroamphetamine (ADDERALL XR) 30 MG 24 hr capsule refilled.  Patient's preferred pharmacy is [facility] on [facility] [facility].  Patient would also like the dosage increased because the medication is not working as well as it use to.

## 2017-12-28 NOTE — Telephone Encounter (Signed)
Pt requesting a dosage increase of Adderall XR 30mg  24hr capsule because it is not working as well as it used to.   LOV: 11/08/17 Dr. Brayton Layman on [facility]

## 2017-12-29 DIAGNOSIS — R198 Other specified symptoms and signs involving the digestive system and abdomen: Secondary | ICD-10-CM | POA: Diagnosis not present

## 2017-12-29 DIAGNOSIS — R1013 Epigastric pain: Secondary | ICD-10-CM | POA: Diagnosis not present

## 2017-12-29 DIAGNOSIS — R1310 Dysphagia, unspecified: Secondary | ICD-10-CM | POA: Diagnosis not present

## 2017-12-29 DIAGNOSIS — R1032 Left lower quadrant pain: Secondary | ICD-10-CM | POA: Diagnosis not present

## 2017-12-29 MED ORDER — AMPHETAMINE-DEXTROAMPHET ER 30 MG PO CP24: 30.0000 mg | ORAL_CAPSULE | Freq: Every day | ORAL | 0 refills | Status: DC

## 2017-12-29 NOTE — Telephone Encounter (Signed)
Patient scheduled. Patient would like a refill on current adderall Last OV 11/08/17 last filled 08/26/17 90 0rf

## 2017-12-30 DIAGNOSIS — M9903 Segmental and somatic dysfunction of lumbar region: Secondary | ICD-10-CM | POA: Diagnosis not present

## 2017-12-30 DIAGNOSIS — M9901 Segmental and somatic dysfunction of cervical region: Secondary | ICD-10-CM | POA: Diagnosis not present

## 2017-12-30 DIAGNOSIS — M542 Cervicalgia: Secondary | ICD-10-CM | POA: Diagnosis not present

## 2017-12-30 DIAGNOSIS — M530 Cervicocranial syndrome: Secondary | ICD-10-CM | POA: Diagnosis not present

## 2018-01-02 DIAGNOSIS — M530 Cervicocranial syndrome: Secondary | ICD-10-CM | POA: Diagnosis not present

## 2018-01-02 DIAGNOSIS — M542 Cervicalgia: Secondary | ICD-10-CM | POA: Diagnosis not present

## 2018-01-02 DIAGNOSIS — M9901 Segmental and somatic dysfunction of cervical region: Secondary | ICD-10-CM | POA: Diagnosis not present

## 2018-01-02 DIAGNOSIS — M9903 Segmental and somatic dysfunction of lumbar region: Secondary | ICD-10-CM | POA: Diagnosis not present

## 2018-01-06 DIAGNOSIS — M542 Cervicalgia: Secondary | ICD-10-CM | POA: Diagnosis not present

## 2018-01-06 DIAGNOSIS — M9903 Segmental and somatic dysfunction of lumbar region: Secondary | ICD-10-CM | POA: Diagnosis not present

## 2018-01-06 DIAGNOSIS — M9901 Segmental and somatic dysfunction of cervical region: Secondary | ICD-10-CM | POA: Diagnosis not present

## 2018-01-06 DIAGNOSIS — M530 Cervicocranial syndrome: Secondary | ICD-10-CM | POA: Diagnosis not present

## 2018-01-13 DIAGNOSIS — M9903 Segmental and somatic dysfunction of lumbar region: Secondary | ICD-10-CM | POA: Diagnosis not present

## 2018-01-13 DIAGNOSIS — M530 Cervicocranial syndrome: Secondary | ICD-10-CM | POA: Diagnosis not present

## 2018-01-13 DIAGNOSIS — M542 Cervicalgia: Secondary | ICD-10-CM | POA: Diagnosis not present

## 2018-01-13 DIAGNOSIS — M9901 Segmental and somatic dysfunction of cervical region: Secondary | ICD-10-CM | POA: Diagnosis not present

## 2018-01-20 DIAGNOSIS — M542 Cervicalgia: Secondary | ICD-10-CM | POA: Diagnosis not present

## 2018-01-20 DIAGNOSIS — M9903 Segmental and somatic dysfunction of lumbar region: Secondary | ICD-10-CM | POA: Diagnosis not present

## 2018-01-20 DIAGNOSIS — M9901 Segmental and somatic dysfunction of cervical region: Secondary | ICD-10-CM | POA: Diagnosis not present

## 2018-01-20 DIAGNOSIS — M530 Cervicocranial syndrome: Secondary | ICD-10-CM | POA: Diagnosis not present

## 2018-02-10 ENCOUNTER — Ambulatory Visit: Payer: BLUE CROSS/BLUE SHIELD | Admitting: Family Medicine

## 2018-02-10 ENCOUNTER — Encounter: Payer: Self-pay | Admitting: Family Medicine

## 2018-02-10 DIAGNOSIS — Z0289 Encounter for other administrative examinations: Secondary | ICD-10-CM

## 2018-02-23 ENCOUNTER — Telehealth: Payer: Self-pay | Admitting: Urology

## 2018-02-23 ENCOUNTER — Other Ambulatory Visit: Payer: Self-pay | Admitting: Family Medicine

## 2018-02-23 NOTE — Telephone Encounter (Signed)
Hurting down low in back, urinary frequency, burns

## 2018-02-24 ENCOUNTER — Ambulatory Visit: Payer: BLUE CROSS/BLUE SHIELD

## 2018-02-24 VITALS — BP 122/63 | HR 70 | Resp 16 | Ht 66.0 in | Wt 128.4 lb

## 2018-02-24 DIAGNOSIS — M9901 Segmental and somatic dysfunction of cervical region: Secondary | ICD-10-CM | POA: Diagnosis not present

## 2018-02-24 DIAGNOSIS — M542 Cervicalgia: Secondary | ICD-10-CM | POA: Diagnosis not present

## 2018-02-24 DIAGNOSIS — R300 Dysuria: Secondary | ICD-10-CM

## 2018-02-24 DIAGNOSIS — M530 Cervicocranial syndrome: Secondary | ICD-10-CM | POA: Diagnosis not present

## 2018-02-24 DIAGNOSIS — M9903 Segmental and somatic dysfunction of lumbar region: Secondary | ICD-10-CM | POA: Diagnosis not present

## 2018-02-24 LAB — MICROSCOPIC EXAMINATION

## 2018-02-24 LAB — URINALYSIS, COMPLETE
BILIRUBIN UA: NEGATIVE
GLUCOSE, UA: NEGATIVE
Ketones, UA: NEGATIVE
LEUKOCYTES UA: NEGATIVE
Nitrite, UA: NEGATIVE
Protein, UA: NEGATIVE
SPEC GRAV UA: 1.015 (ref 1.005–1.030)
UUROB: 0.2 mg/dL (ref 0.2–1.0)
pH, UA: 6 (ref 5.0–7.5)

## 2018-02-24 NOTE — Telephone Encounter (Signed)
Patient called back this morning.  She is nausea, vomiting, fatigued, low back pain, burning with urination and frequency.  Added patient to the nurse schedule for today.

## 2018-02-24 NOTE — Progress Notes (Signed)
Suzanne Cruz is present in office today for nurse visit. She is complaining of urinary frequency, difficulty postponing, dysuria, leakage, back/flank pain, lower abdominal pain, nausea, and urinary urgency. Pt denies being on abx in the past 30 days. Pt denies urological surgery in the past 30 days. Pt provided urine sample for analysis and culture. Urine was sent out for culture.

## 2018-02-27 LAB — CULTURE, URINE COMPREHENSIVE

## 2018-02-28 ENCOUNTER — Telehealth: Payer: Self-pay

## 2018-02-28 NOTE — Telephone Encounter (Signed)
-----   Message from Nori Riis, PA-C sent at 02/27/2018  4:57 PM EDT ----- Please let Mrs. Suzanne Cruz know the urine culture is negative for an UTI.  She needs an office visit if she is still having symptoms.

## 2018-03-09 DIAGNOSIS — I83812 Varicose veins of left lower extremities with pain: Secondary | ICD-10-CM | POA: Diagnosis not present

## 2018-03-09 DIAGNOSIS — I83892 Varicose veins of left lower extremities with other complications: Secondary | ICD-10-CM | POA: Diagnosis not present

## 2018-03-09 DIAGNOSIS — I8002 Phlebitis and thrombophlebitis of superficial vessels of left lower extremity: Secondary | ICD-10-CM | POA: Diagnosis not present

## 2018-03-10 DIAGNOSIS — R102 Pelvic and perineal pain: Secondary | ICD-10-CM | POA: Diagnosis not present

## 2018-03-10 DIAGNOSIS — M530 Cervicocranial syndrome: Secondary | ICD-10-CM | POA: Diagnosis not present

## 2018-03-10 DIAGNOSIS — M9903 Segmental and somatic dysfunction of lumbar region: Secondary | ICD-10-CM | POA: Diagnosis not present

## 2018-03-10 DIAGNOSIS — M9901 Segmental and somatic dysfunction of cervical region: Secondary | ICD-10-CM | POA: Diagnosis not present

## 2018-03-16 ENCOUNTER — Encounter: Payer: Self-pay | Admitting: *Deleted

## 2018-03-17 ENCOUNTER — Ambulatory Visit
Admission: RE | Admit: 2018-03-17 | Discharge: 2018-03-17 | Disposition: A | Discharge status: Home / Self Care | Payer: BLUE CROSS/BLUE SHIELD | Source: Ambulatory Visit | Attending: Unknown Physician Specialty | Admitting: Unknown Physician Specialty

## 2018-03-17 ENCOUNTER — Ambulatory Visit: Payer: BLUE CROSS/BLUE SHIELD | Admitting: Anesthesiology

## 2018-03-17 ENCOUNTER — Encounter
Admission: RE | Disposition: A | Discharge status: Home / Self Care | Payer: Self-pay | Source: Ambulatory Visit | Attending: Unknown Physician Specialty

## 2018-03-17 ENCOUNTER — Encounter: Payer: Self-pay | Admitting: *Deleted

## 2018-03-17 DIAGNOSIS — K5730 Diverticulosis of large intestine without perforation or abscess without bleeding: Secondary | ICD-10-CM | POA: Diagnosis not present

## 2018-03-17 DIAGNOSIS — K589 Irritable bowel syndrome without diarrhea: Secondary | ICD-10-CM | POA: Insufficient documentation

## 2018-03-17 DIAGNOSIS — D122 Benign neoplasm of ascending colon: Secondary | ICD-10-CM | POA: Insufficient documentation

## 2018-03-17 DIAGNOSIS — F329 Major depressive disorder, single episode, unspecified: Secondary | ICD-10-CM | POA: Insufficient documentation

## 2018-03-17 DIAGNOSIS — R1310 Dysphagia, unspecified: Secondary | ICD-10-CM | POA: Diagnosis not present

## 2018-03-17 DIAGNOSIS — D123 Benign neoplasm of transverse colon: Secondary | ICD-10-CM | POA: Diagnosis not present

## 2018-03-17 DIAGNOSIS — K2950 Unspecified chronic gastritis without bleeding: Secondary | ICD-10-CM | POA: Diagnosis not present

## 2018-03-17 DIAGNOSIS — K319 Disease of stomach and duodenum, unspecified: Secondary | ICD-10-CM | POA: Diagnosis not present

## 2018-03-17 DIAGNOSIS — K635 Polyp of colon: Secondary | ICD-10-CM | POA: Diagnosis not present

## 2018-03-17 DIAGNOSIS — K222 Esophageal obstruction: Secondary | ICD-10-CM | POA: Diagnosis not present

## 2018-03-17 DIAGNOSIS — K3189 Other diseases of stomach and duodenum: Secondary | ICD-10-CM | POA: Diagnosis not present

## 2018-03-17 DIAGNOSIS — K5790 Diverticulosis of intestine, part unspecified, without perforation or abscess without bleeding: Secondary | ICD-10-CM | POA: Diagnosis not present

## 2018-03-17 DIAGNOSIS — Z1211 Encounter for screening for malignant neoplasm of colon: Secondary | ICD-10-CM | POA: Diagnosis not present

## 2018-03-17 DIAGNOSIS — K219 Gastro-esophageal reflux disease without esophagitis: Secondary | ICD-10-CM | POA: Diagnosis not present

## 2018-03-17 DIAGNOSIS — Z79899 Other long term (current) drug therapy: Secondary | ICD-10-CM | POA: Insufficient documentation

## 2018-03-17 DIAGNOSIS — I739 Peripheral vascular disease, unspecified: Secondary | ICD-10-CM | POA: Insufficient documentation

## 2018-03-17 DIAGNOSIS — K317 Polyp of stomach and duodenum: Secondary | ICD-10-CM | POA: Diagnosis not present

## 2018-03-17 DIAGNOSIS — F419 Anxiety disorder, unspecified: Secondary | ICD-10-CM | POA: Insufficient documentation

## 2018-03-17 DIAGNOSIS — Z8711 Personal history of peptic ulcer disease: Secondary | ICD-10-CM | POA: Diagnosis not present

## 2018-03-17 DIAGNOSIS — K2970 Gastritis, unspecified, without bleeding: Secondary | ICD-10-CM | POA: Diagnosis not present

## 2018-03-17 HISTORY — PX: ESOPHAGOGASTRODUODENOSCOPY (EGD) WITH PROPOFOL: SHX5813

## 2018-03-17 HISTORY — PX: COLONOSCOPY WITH PROPOFOL: SHX5780

## 2018-03-17 HISTORY — DX: Cyst of kidney, acquired: N28.1

## 2018-03-17 SURGERY — COLONOSCOPY WITH PROPOFOL
Anesthesia: General

## 2018-03-17 MED ORDER — SODIUM CHLORIDE 0.9 % IV SOLN
INTRAVENOUS | Status: DC
  Administered 2018-03-17: 1000 mL via INTRAVENOUS

## 2018-03-17 MED ORDER — MIDAZOLAM HCL 2 MG/2ML IJ SOLN
INTRAMUSCULAR | Status: DC | PRN
  Administered 2018-03-17: 2 mg via INTRAVENOUS

## 2018-03-17 MED ORDER — GLYCOPYRROLATE 0.2 MG/ML IJ SOLN
INTRAMUSCULAR | Status: DC | PRN
  Administered 2018-03-17: 0.1 mg via INTRAVENOUS

## 2018-03-17 MED ORDER — MIDAZOLAM HCL 2 MG/2ML IJ SOLN
INTRAMUSCULAR | Status: AC
  Filled 2018-03-17: qty 2

## 2018-03-17 MED ORDER — PROPOFOL 10 MG/ML IV BOLUS
INTRAVENOUS | Status: DC | PRN
  Administered 2018-03-17: 50 mg via INTRAVENOUS

## 2018-03-17 MED ORDER — PROPOFOL 500 MG/50ML IV EMUL
INTRAVENOUS | Status: DC | PRN
  Administered 2018-03-17: 100 ug/kg/min via INTRAVENOUS

## 2018-03-17 MED ORDER — PROPOFOL 500 MG/50ML IV EMUL
INTRAVENOUS | Status: AC
  Filled 2018-03-17: qty 50

## 2018-03-17 NOTE — Anesthesia Postprocedure Evaluation (Signed)
Anesthesia Post Note  Patient: Development worker, community  Procedure(s) Performed: COLONOSCOPY WITH PROPOFOL (N/A ) ESOPHAGOGASTRODUODENOSCOPY (EGD) WITH PROPOFOL (N/A )  Patient location during evaluation: Endoscopy Anesthesia Type: General Level of consciousness: awake and alert Pain management: pain level controlled Vital Signs Assessment: post-procedure vital signs reviewed and stable Respiratory status: spontaneous breathing, nonlabored ventilation, respiratory function stable and patient connected to nasal cannula oxygen Cardiovascular status: blood pressure returned to baseline and stable Postop Assessment: no apparent nausea or vomiting Anesthetic complications: no     Last Vitals:  Vitals:   03/17/18 1151 03/17/18 1221  BP: 104/76 107/60  Pulse: 95   Resp: 17   Temp: (!) 36.1 C   SpO2: 100%     Last Pain:  Vitals:   03/17/18 1221  TempSrc:   PainSc: 0-No pain                 Precious Haws Claron Rosencrans

## 2018-03-17 NOTE — Op Note (Signed)
[facility]enter[facility] Patient Name: Suzanne Cruz Procedure Date: 03/17/2018 11:08 AM MRN: [id number] Account #: [id number] Date of Birth: 08/08/1966 Admit Type: Outpatient Age: 52 Room: [facility] ENDO ROOM 3 Gender: Female Note Status: Finalized Procedure:            Upper GI endoscopy Indications:          Dysphagia Providers:            [name], MD Referring MD:         [name]. Caryl Bis (Referring MD) Medicines:            Propofol per Anesthesia Complications:        No immediate complications. Procedure:            Pre-Anesthesia Assessment:                       - After reviewing the risks and benefits, the patient                        was deemed in satisfactory condition to undergo the                        procedure.                       After obtaining informed consent, the endoscope was                        passed under direct vision. Throughout the procedure,                        the patient's blood pressure, pulse, and oxygen                        saturations were monitored continuously. The Endoscope                        was introduced through the mouth, and advanced to the                        second part of duodenum. The upper GI endoscopy was                        accomplished without difficulty. The patient tolerated                        the procedure well. Findings:      A mild Schatzki ring was found at the gastroesophageal junction. A       guidewire was placed and the scope was withdrawn. Dilation was performed       with a Savary dilator with mild resistance at 17 mm.      Striped colored minimal inflammation characterized by erythema was found       in the gastric antrum. Biopsies were taken with a cold forceps for       histology. Biopsies were taken with a cold forceps for Helicobacter       pylori testing.      The examined duodenum was normal. Impression:           - Mild Schatzki ring. Dilated.                -  Gastritis. Biopsied. Recommendation:       - Await pathology results. [name], MD 03/17/2018 11:28:14 AM This report has been signed electronically. Number of Addenda: 0 Note Initiated On: 03/17/2018 11:08 AM      [facility]

## 2018-03-17 NOTE — Anesthesia Preprocedure Evaluation (Signed)
Anesthesia Evaluation  Patient identified by MRN, date of birth, ID band Patient awake    Reviewed: Allergy & Precautions, H&P , NPO status , Patient's Chart, lab work & pertinent test results  History of Anesthesia Complications Negative for: history of anesthetic complications  Airway Mallampati: III  TM Distance: <3 FB Neck ROM: full    Dental  (+) Chipped   Pulmonary neg pulmonary ROS, neg shortness of breath,           Cardiovascular Exercise Tolerance: Good (-) angina+ Peripheral Vascular Disease  (-) Past MI and (-) DOE negative cardio ROS       Neuro/Psych  Headaches, PSYCHIATRIC DISORDERS Anxiety Depression negative neurological ROS     GI/Hepatic negative GI ROS, Neg liver ROS, GERD  Medicated and Controlled,  Endo/Other  negative endocrine ROS  Renal/GU Renal disease  negative genitourinary   Musculoskeletal   Abdominal   Peds  Hematology negative hematology ROS (+)   Anesthesia Other Findings Past Medical History: No date: Chicken pox 2015: Colon polyps No date: Cyst of left kidney No date: Depression No date: Dysphagia No date: Endometriosis No date: GERD (gastroesophageal reflux disease) 09/17/2015: History of gastritis No date: History of kidney stones No date: History of stomach ulcers No date: IBS (irritable bowel syndrome) No date: Migraines  Past Surgical History: 2001: ABDOMINAL HYSTERECTOMY     Comment:  partial only right ovary is the only thing she as. 2005: BREAST SURGERY; Left     Comment:  biopsy 2004: BUNIONECTOMY; Left 02/10/2015: ESOPHAGOGASTRODUODENOSCOPY (EGD) WITH PROPOFOL; N/A     Comment:  Procedure: ESOPHAGOGASTRODUODENOSCOPY (EGD) WITH               PROPOFOL;  Surgeon: [name], MD;  Location: [facility]              ENDOSCOPY;  Service: Endoscopy;  Laterality: N/A; 1993: LITHOTRIPSY  BMI    Body Mass Index:  20.80 kg/m       Reproductive/Obstetrics negative OB ROS                             Anesthesia Physical Anesthesia Plan  ASA: III  Anesthesia Plan: General   Post-op Pain Management:    Induction: Intravenous  PONV Risk Score and Plan: Propofol infusion and TIVA  Airway Management Planned: Natural Airway and Nasal Cannula  Additional Equipment:   Intra-op Plan:   Post-operative Plan:   Informed Consent: I have reviewed the patients History and Physical, chart, labs and discussed the procedure including the risks, benefits and alternatives for the proposed anesthesia with the patient or authorized representative who has indicated his/her understanding and acceptance.   Dental Advisory Given  Plan Discussed with: Anesthesiologist, CRNA and Surgeon  Anesthesia Plan Comments: (Patient consented for risks of anesthesia including but not limited to:  - adverse reactions to medications - risk of intubation if required - damage to teeth, lips or other oral mucosa - sore throat or hoarseness - Damage to heart, brain, lungs or loss of life  Patient voiced understanding.)        Anesthesia Quick Evaluation

## 2018-03-17 NOTE — Transfer of Care (Signed)
Immediate Anesthesia Transfer of Care Note  Patient: Suzanne Cruz  Procedure(s) Performed: COLONOSCOPY WITH PROPOFOL (N/A ) ESOPHAGOGASTRODUODENOSCOPY (EGD) WITH PROPOFOL (N/A )  Patient Location: PACU and Endoscopy Unit  Anesthesia Type:General  Level of Consciousness: awake, alert  and oriented  Airway & Oxygen Therapy: Patient Spontanous Breathing  Post-op Assessment: Report given to RN and Post -op Vital signs reviewed and stable  Post vital signs: Reviewed and stable  Last Vitals:  Vitals Value Taken Time  BP 104/76 03/17/2018 11:51 AM  Temp    Pulse    Resp 18 03/17/2018 11:51 AM  SpO2    Vitals shown include unvalidated device data.  Last Pain:  Vitals:   03/17/18 1023  TempSrc: Tympanic  PainSc: 5       Patients Stated Pain Goal: 0 (35/43/01 4840)  Complications: No apparent anesthesia complications

## 2018-03-17 NOTE — Anesthesia Post-op Follow-up Note (Signed)
Anesthesia QCDR form completed.        

## 2018-03-17 NOTE — H&P (Signed)
Primary Care Physician:  Leone Haven, MD Primary Gastroenterologist:  Dr. Vira Agar  Pre-Procedure History & Physical: HPI:  Suzanne Cruz is a 52 y.o. female is here for an endoscopy and colonoscopy.  Done for FH colon polyps and for dysphagia.   Past Medical History:  Diagnosis Date  . Chicken pox   . Colon polyps 2015  . Cyst of left kidney   . Depression   . Dysphagia   . Endometriosis   . GERD (gastroesophageal reflux disease)   . History of gastritis 09/17/2015  . History of kidney stones   . History of stomach ulcers   . IBS (irritable bowel syndrome)   . Migraines     Past Surgical History:  Procedure Laterality Date  . ABDOMINAL HYSTERECTOMY  2001   partial only right ovary is the only thing she as.  Marland Kitchen BREAST SURGERY Left 2005   biopsy  . BUNIONECTOMY Left 2004  . ESOPHAGOGASTRODUODENOSCOPY (EGD) WITH PROPOFOL N/A 02/10/2015   Procedure: ESOPHAGOGASTRODUODENOSCOPY (EGD) WITH PROPOFOL;  Surgeon: [name], MD;  Location: [facility] ENDOSCOPY;  Service: Endoscopy;  Laterality: N/A;  . LITHOTRIPSY  1993    Prior to Admission medications   Medication Sig Start Date End Date Taking? Authorizing Provider  amphetamine-dextroamphetamine (ADDERALL XR) 30 MG 24 hr capsule Take 1 capsule (30 mg total) by mouth daily. 12/29/17  Yes Leone Haven, MD  cetirizine (ZYRTEC) 10 MG tablet Take 10 mg by mouth daily as needed for allergies.   Yes [provider]  cyclobenzaprine (FLEXERIL) 5 MG tablet Take 1 tablet (5 mg total) by mouth 3 (three) times daily as needed for muscle spasms. Must keep appt in Feb for further refills 09/22/17  Yes Leone Haven, MD  lubiprostone (AMITIZA) 8 MCG capsule Take 8 mcg by mouth 2 (two) times daily as needed for constipation.    Yes [provider]  omeprazole (PRILOSEC) 40 MG capsule Take 1 capsule (40 mg total) by mouth daily. 09/30/16  Yes Cook, Jayce G, DO  ranitidine (ZANTAC) 150 MG tablet Take 150 mg by mouth 2  (two) times daily. 08/18/15  Yes [provider]  sucralfate (CARAFATE) 1 G tablet Take 1 g by mouth 2 (two) times daily.    Yes [provider]  tizanidine (ZANAFLEX) 6 MG capsule Take 6 mg by mouth daily as needed for muscle spasms.   Yes [provider]  diclofenac (VOLTAREN) 75 MG EC tablet TAKE 1 TABLET BY MOUTH TWICE A DAY MUST KEEP APPT IN FEB FOR FURTHER REFILLS Patient not taking: Reported on 03/17/2018 02/23/18   Leone Haven, MD  escitalopram (LEXAPRO) 10 MG tablet Take 1 tablet (10 mg total) by mouth daily. Patient not taking: Reported on 03/17/2018 10/14/17   Leone Haven, MD  FLUoxetine (PROZAC) 40 MG capsule Take 1 capsule (40 mg total) by mouth daily. Patient not taking: Reported on 03/17/2018 09/03/16   Coral Spikes, DO  lidocaine-prilocaine (EMLA) cream Apply 1 application topically at bedtime. Reported on 09/17/2015 12/10/14   [provider]    Allergies as of 01/09/2018 - Review Complete 11/08/2017  Allergen Reaction Noted  . Sulfa antibiotics Rash 01/17/2015    Family History  Problem Relation Age of Onset  . Sudden death Brother   . Hypertension Mother   . Colon polyps Mother   . Hypertension Brother   . Stroke Paternal Grandmother     Social History   Socioeconomic History  . Marital status: Married  Spouse name: Not on file  . Number of children: Not on file  . Years of education: Not on file  . Highest education level: Not on file  Occupational History  . Not on file  Social Needs  . Financial resource strain: Not on file  . Food insecurity:    Worry: Not on file    Inability: Not on file  . Transportation needs:    Medical: Not on file    Non-medical: Not on file  Tobacco Use  . Smoking status: Never Smoker  . Smokeless tobacco: Never Used  Substance and Sexual Activity  . Alcohol use: Yes    Alcohol/week: 0.0 - 0.6 oz  . Drug use: No  . Sexual activity: Yes    Birth control/protection:  Post-menopausal  Lifestyle  . Physical activity:    Days per week: Not on file    Minutes per session: Not on file  . Stress: Not on file  Relationships  . Social connections:    Talks on phone: Not on file    Gets together: Not on file    Attends religious service: Not on file    Active member of club or organization: Not on file    Attends meetings of clubs or organizations: Not on file    Relationship status: Not on file  . Intimate partner violence:    Fear of current or ex partner: Not on file    Emotionally abused: Not on file    Physically abused: Not on file    Forced sexual activity: Not on file  Other Topics Concern  . Not on file  Social History Narrative  . Not on file    Review of Systems: See HPI, otherwise negative ROS  Physical Exam: BP (!) 120/57   Pulse 70   Temp (!) 96.3 F (35.7 C) (Tympanic)   Resp 16   Ht 5\' 5"  (1.651 m)   Wt 56.7 kg (125 lb)   SpO2 100%   BMI 20.80 kg/m  General:   Alert,  pleasant and cooperative in NAD Head:  Normocephalic and atraumatic. Neck:  Supple; no masses or thyromegaly. Lungs:  Clear throughout to auscultation.    Heart:  Regular rate and rhythm. Abdomen:  Soft, nontender and nondistended. Normal bowel sounds, without guarding, and without rebound.   Neurologic:  Alert and  oriented x4;  grossly normal neurologically.  Impression/Plan: [name] is here for an endoscopy and colonoscopy to be performed for FH colon polyps and Personal history of recurrent dysphagia.  Risks, benefits, limitations, and alternatives regarding  endoscopy and colonoscopy have been reviewed with the patient.  Questions have been answered.  All parties agreeable.   Gaylyn Cheers, MD  03/17/2018, 11:11 AM

## 2018-03-17 NOTE — Op Note (Signed)
[facility] Gastroenterology Patient Name: Suzanne Cruz Procedure Date: 03/17/2018 11:08 AM MRN: [id number] Account #: [id number] Date of Birth: 1965-12-14 Admit Type: Outpatient Age: 52 Room: [facility] ENDO ROOM 3 Gender: Female Note Status: Finalized Procedure:            Colonoscopy Indications:          Screening for colorectal malignant neoplasm Providers:            [name], MD Referring MD:         [name]. Caryl Bis (Referring MD) Medicines:            Propofol per Anesthesia Complications:        No immediate complications. Procedure:            Pre-Anesthesia Assessment:                       - After reviewing the risks and benefits, the patient                        was deemed in satisfactory condition to undergo the                        procedure.                       After obtaining informed consent, the colonoscope was                        passed under direct vision. Throughout the procedure,                        the patient's blood pressure, pulse, and oxygen                        saturations were monitored continuously. The                        Colonoscope was introduced through the anus and                        advanced to the the cecum, identified by appendiceal                        orifice and ileocecal valve. The colonoscopy was                        performed without difficulty. The patient tolerated the                        procedure well. The quality of the bowel preparation                        was good. Findings:      A diminutive polyp was found in the ascending colon. The polyp was       sessile. The polyp was removed with a jumbo cold forceps. Resection and       retrieval were complete.      Two sessile polyps were found in the transverse colon. The polyps were       diminutive in size. These polyps were removed with a jumbo cold forceps.  Resection and retrieval were complete.      A few  small-mouthed diverticula were found in the sigmoid colon.      The exam was otherwise without abnormality. Impression:           - One diminutive polyp in the ascending colon, removed                        with a jumbo cold forceps. Resected and retrieved.                       - Two diminutive polyps in the transverse colon,                        removed with a jumbo cold forceps. Resected and                        retrieved.                       - Diverticulosis in the sigmoid colon.                       - The examination was otherwise normal. Recommendation:       - Await pathology results. [name], MD 03/17/2018 11:53:54 AM This report has been signed electronically. Number of Addenda: 0 Note Initiated On: 03/17/2018 11:08 AM Scope Withdrawal Time: 0 hours 13 minutes 38 seconds  Total Procedure Duration: 0 hours 17 minutes 40 seconds       [facility]

## 2018-03-21 LAB — SURGICAL PATHOLOGY

## 2018-03-24 ENCOUNTER — Telehealth: Payer: Self-pay | Admitting: Family Medicine

## 2018-03-24 DIAGNOSIS — Z1231 Encounter for screening mammogram for malignant neoplasm of breast: Secondary | ICD-10-CM | POA: Diagnosis not present

## 2018-03-24 DIAGNOSIS — R102 Pelvic and perineal pain: Secondary | ICD-10-CM | POA: Diagnosis not present

## 2018-03-24 NOTE — Telephone Encounter (Signed)
Please advise, I can place her on cancellation list?. Patient had appointment with you in June and no showed

## 2018-03-24 NOTE — Telephone Encounter (Signed)
Copied from [place] [id number]. Topic: Appointment Scheduling - Scheduling Inquiry for Clinic >> Mar 24, 2018 11:30 AM [name] wrote: Patient would like to come in for a medication change for her  amphetamine-dextroamphetamine (ADDERALL XR) 30 MG 24 hr capsule, she is wanting to increase this. Next available appointment is October. Please call patient to be worked in.

## 2018-03-26 NOTE — Telephone Encounter (Signed)
She could be put on the cancellation list.

## 2018-03-27 NOTE — Telephone Encounter (Signed)
The patient has been placed on the wait list for Dr. Caryl Bis.

## 2018-03-27 NOTE — Telephone Encounter (Signed)
Please place her on cancellation list

## 2018-03-28 ENCOUNTER — Ambulatory Visit: Payer: BLUE CROSS/BLUE SHIELD | Admitting: Family Medicine

## 2018-03-30 ENCOUNTER — Other Ambulatory Visit: Payer: Self-pay | Admitting: Family Medicine

## 2018-03-30 DIAGNOSIS — M5412 Radiculopathy, cervical region: Secondary | ICD-10-CM | POA: Diagnosis not present

## 2018-03-30 DIAGNOSIS — M4310 Spondylolisthesis, site unspecified: Secondary | ICD-10-CM | POA: Diagnosis not present

## 2018-03-31 DIAGNOSIS — M9901 Segmental and somatic dysfunction of cervical region: Secondary | ICD-10-CM | POA: Diagnosis not present

## 2018-03-31 DIAGNOSIS — M530 Cervicocranial syndrome: Secondary | ICD-10-CM | POA: Diagnosis not present

## 2018-03-31 DIAGNOSIS — M542 Cervicalgia: Secondary | ICD-10-CM | POA: Diagnosis not present

## 2018-03-31 NOTE — Telephone Encounter (Signed)
Last OV 11/08/17 last filled Cyclobenzaprine 09/22/17 30 0rf Diclofenac 02/23/18 30 0rf

## 2018-04-02 ENCOUNTER — Other Ambulatory Visit: Payer: Self-pay | Admitting: Family Medicine

## 2018-04-03 NOTE — Telephone Encounter (Signed)
Last Ov 11/08/17 last filled  Diclofenac 02/23/18 30 0rf Adderal 12/29/17 90 0rf

## 2018-04-03 NOTE — Telephone Encounter (Signed)
Last OV 11/08/17 last filled 09/22/17 30 0rf

## 2018-04-04 MED ORDER — AMPHETAMINE-DEXTROAMPHET ER 30 MG PO CP24: 30.0000 mg | ORAL_CAPSULE | Freq: Every day | ORAL | 0 refills | Status: DC

## 2018-04-04 NOTE — Telephone Encounter (Signed)
Please contact the patient and find out what she takes this for.  Thanks.

## 2018-04-04 NOTE — Telephone Encounter (Signed)
Please find out what the patient takes these for.  It looks like she just had an upper endoscopy that revealed gastritis.  That would make the diclofenac a poor option for pain control.

## 2018-04-04 NOTE — Telephone Encounter (Signed)
Adderall sent to pharmacy.  Please see other prescription refill request regarding diclofenac.

## 2018-04-04 NOTE — Telephone Encounter (Signed)
Patient states she takes the diclofenac and flexeril for neck pain and headaches.patient notified that this is not a good option.

## 2018-04-06 DIAGNOSIS — M5030 Other cervical disc degeneration, unspecified cervical region: Secondary | ICD-10-CM | POA: Diagnosis not present

## 2018-04-06 NOTE — Telephone Encounter (Signed)
Patient takes this for neck pain

## 2018-04-07 ENCOUNTER — Encounter

## 2018-04-07 DIAGNOSIS — M9903 Segmental and somatic dysfunction of lumbar region: Secondary | ICD-10-CM | POA: Diagnosis not present

## 2018-04-07 DIAGNOSIS — M9901 Segmental and somatic dysfunction of cervical region: Secondary | ICD-10-CM | POA: Diagnosis not present

## 2018-04-07 MED ORDER — CYCLOBENZAPRINE HCL 5 MG PO TABS: 5.0000 mg | ORAL_TABLET | Freq: Three times a day (TID) | ORAL | 0 refills | Status: DC | PRN

## 2018-04-07 NOTE — Telephone Encounter (Signed)
We need to discuss an alternative medication to the diclofenac.  We can discuss this at her next office visit.

## 2018-04-13 DIAGNOSIS — M542 Cervicalgia: Secondary | ICD-10-CM | POA: Diagnosis not present

## 2018-04-13 DIAGNOSIS — M25512 Pain in left shoulder: Secondary | ICD-10-CM | POA: Diagnosis not present

## 2018-04-13 DIAGNOSIS — M25511 Pain in right shoulder: Secondary | ICD-10-CM | POA: Diagnosis not present

## 2018-04-14 DIAGNOSIS — M9903 Segmental and somatic dysfunction of lumbar region: Secondary | ICD-10-CM | POA: Diagnosis not present

## 2018-04-14 DIAGNOSIS — N6311 Unspecified lump in the right breast, upper outer quadrant: Secondary | ICD-10-CM | POA: Diagnosis not present

## 2018-04-14 DIAGNOSIS — M530 Cervicocranial syndrome: Secondary | ICD-10-CM | POA: Diagnosis not present

## 2018-04-14 DIAGNOSIS — M542 Cervicalgia: Secondary | ICD-10-CM | POA: Diagnosis not present

## 2018-04-14 DIAGNOSIS — R928 Other abnormal and inconclusive findings on diagnostic imaging of breast: Secondary | ICD-10-CM | POA: Diagnosis not present

## 2018-04-14 DIAGNOSIS — M9901 Segmental and somatic dysfunction of cervical region: Secondary | ICD-10-CM | POA: Diagnosis not present

## 2018-04-14 DIAGNOSIS — N6312 Unspecified lump in the right breast, upper inner quadrant: Secondary | ICD-10-CM | POA: Diagnosis not present

## 2018-04-21 DIAGNOSIS — M542 Cervicalgia: Secondary | ICD-10-CM | POA: Diagnosis not present

## 2018-04-21 DIAGNOSIS — M9901 Segmental and somatic dysfunction of cervical region: Secondary | ICD-10-CM | POA: Diagnosis not present

## 2018-04-21 DIAGNOSIS — M530 Cervicocranial syndrome: Secondary | ICD-10-CM | POA: Diagnosis not present

## 2018-04-21 DIAGNOSIS — M9903 Segmental and somatic dysfunction of lumbar region: Secondary | ICD-10-CM | POA: Diagnosis not present

## 2018-05-05 DIAGNOSIS — M25511 Pain in right shoulder: Secondary | ICD-10-CM | POA: Diagnosis not present

## 2018-05-05 DIAGNOSIS — M542 Cervicalgia: Secondary | ICD-10-CM | POA: Diagnosis not present

## 2018-05-05 DIAGNOSIS — M25512 Pain in left shoulder: Secondary | ICD-10-CM | POA: Diagnosis not present

## 2018-05-11 DIAGNOSIS — H16423 Pannus (corneal), bilateral: Secondary | ICD-10-CM | POA: Diagnosis not present

## 2018-05-11 DIAGNOSIS — H17823 Peripheral opacity of cornea, bilateral: Secondary | ICD-10-CM | POA: Diagnosis not present

## 2018-05-12 DIAGNOSIS — M542 Cervicalgia: Secondary | ICD-10-CM | POA: Diagnosis not present

## 2018-05-12 DIAGNOSIS — M25511 Pain in right shoulder: Secondary | ICD-10-CM | POA: Diagnosis not present

## 2018-05-12 DIAGNOSIS — M25512 Pain in left shoulder: Secondary | ICD-10-CM | POA: Diagnosis not present

## 2018-05-16 ENCOUNTER — Encounter: Payer: Self-pay | Admitting: Internal Medicine

## 2018-05-16 ENCOUNTER — Ambulatory Visit: Payer: BLUE CROSS/BLUE SHIELD | Admitting: Internal Medicine

## 2018-05-16 VITALS — BP 112/60 | HR 95 | Temp 97.5°F | Resp 16 | Ht 65.0 in | Wt 131.2 lb

## 2018-05-16 DIAGNOSIS — R1030 Lower abdominal pain, unspecified: Secondary | ICD-10-CM

## 2018-05-16 DIAGNOSIS — M79671 Pain in right foot: Secondary | ICD-10-CM

## 2018-05-16 DIAGNOSIS — R7303 Prediabetes: Secondary | ICD-10-CM | POA: Insufficient documentation

## 2018-05-16 DIAGNOSIS — R200 Anesthesia of skin: Secondary | ICD-10-CM | POA: Diagnosis not present

## 2018-05-16 DIAGNOSIS — R300 Dysuria: Secondary | ICD-10-CM | POA: Diagnosis not present

## 2018-05-16 DIAGNOSIS — R202 Paresthesia of skin: Secondary | ICD-10-CM | POA: Diagnosis not present

## 2018-05-16 LAB — POCT URINALYSIS DIPSTICK
BILIRUBIN UA: NEGATIVE
Blood, UA: NEGATIVE
GLUCOSE UA: NEGATIVE
Ketones, UA: NEGATIVE
Leukocytes, UA: NEGATIVE
Nitrite, UA: NEGATIVE
Protein, UA: NEGATIVE
Spec Grav, UA: >=1.03 — AB (ref 1.010–1.025)
Urobilinogen, UA: 0.2 E.U./dL
pH, UA: 6 (ref 5.0–8.0)

## 2018-05-16 NOTE — Assessment & Plan Note (Signed)
Patient is aware of her increased risk but has not changed her high sugar diet.  Low GI diet  Recommended and a1c repeated

## 2018-05-16 NOTE — Assessment & Plan Note (Signed)
Not clear if pain is neuropathic or referred from plantar callous/bunion.   If screening labs are normal. Will refer to podiatry

## 2018-05-16 NOTE — Patient Instructions (Signed)
Your foot pain may be a neuropathy either from a metabolic problem or from a structural problem  If your labs are normal, I;ll make the podiatry referral  Ice for your back  Urine culture  Will be available in 2 days to rule out UTI.  If negative, talk to your urologist about "interstitial cystitis" as a potential cause of your bladder pain  DRINK 60 OUNCES OF WATER DAILY!  Prediabetes Eating Plan Prediabetes-also called impaired glucose tolerance or impaired fasting glucose-is a condition that causes blood sugar (blood glucose) levels to be higher than normal. Following a healthy diet can help to keep prediabetes under control. It can also help to lower the risk of type 2 diabetes and heart disease, which are increased in people who have prediabetes. Along with regular exercise, a healthy diet:  Promotes weight loss.  Helps to control blood sugar levels.  Helps to improve the way that the body uses insulin.  What do I need to know about this eating plan?  Use the glycemic index (GI) to plan your meals. The index tells you how quickly a food will raise your blood sugar. Choose low-GI foods. These foods take a longer time to raise blood sugar.  Pay close attention to the amount of carbohydrates in the food that you eat. Carbohydrates increase blood sugar levels.  Keep track of how many calories you take in. Eating the right amount of calories will help you to achieve a healthy weight. Losing about 7 percent of your starting weight can help to prevent type 2 diabetes.  You may want to follow a Mediterranean diet. This diet includes a lot of vegetables, lean meats or fish, whole grains, fruits, and healthy oils and fats. What foods can I eat? Grains Whole grains, such as whole-wheat or whole-grain breads, crackers, cereals, and pasta. Unsweetened oatmeal. Bulgur. Barley. Quinoa. Brown rice. Corn or whole-wheat flour tortillas or taco shells. Vegetables Lettuce. Spinach. Peas. Beets.  Cauliflower. Cabbage. Broccoli. Carrots. Tomatoes. Squash. Eggplant. Herbs. Peppers. Onions. Cucumbers. Brussels sprouts. Fruits Berries. Bananas. Apples. Oranges. Grapes. Papaya. Mango. Pomegranate. Kiwi. Grapefruit. Cherries. Meats and Other Protein Sources Seafood. Lean meats, such as chicken and [place] or lean cuts of pork and beef. Tofu. Eggs. Nuts. Beans. Dairy Low-fat or fat-free dairy products, such as yogurt, cottage cheese, and cheese. Beverages Water. Tea. Coffee. Sugar-free or diet soda. Seltzer water. Milk. Milk alternatives, such as soy or almond milk. Condiments Mustard. Relish. Low-fat, low-sugar ketchup. Low-fat, low-sugar barbecue sauce. Low-fat or fat-free mayonnaise. Sweets and Desserts Sugar-free or low-fat pudding. Sugar-free or low-fat ice cream and other frozen treats. Fats and Oils Avocado. Walnuts. Olive oil. The items listed above may not be a complete list of recommended foods or beverages. Contact your dietitian for more options. What foods are not recommended? Grains Refined white flour and flour products, such as bread, pasta, snack foods, and cereals. Beverages Sweetened drinks, such as sweet iced tea and soda. Sweets and Desserts Baked goods, such as cake, cupcakes, pastries, cookies, and cheesecake. The items listed above may not be a complete list of foods and beverages to avoid. Contact your dietitian for more information. This information is not intended to replace advice given to you by your health care provider. Make sure you discuss any questions you have with your health care provider. Document Released: 12/31/2014 Document Revised: 01/22/2016 Document Reviewed: 09/11/2014 Elsevier Interactive Patient Education  2017 [employer].

## 2018-05-16 NOTE — Progress Notes (Signed)
Subjective:  Patient ID: Suzanne Cruz, female    DOB: Jan 29, 1966  Age: 52 y.o. MRN: [id number]  CC: The primary encounter diagnosis was Dysuria. Diagnoses of Numbness and tingling of foot, Foot pain, right, Lower abdominal pain, and Prediabetes were also pertinent to this visit.  HPI Suzanne Cruz presents for evaluation off multiple issues;   burning ON THE PLANTAR surface of her  right foot  Over the MT heads for several weeks.   Has a small bunion as well as a  Callous on the plantar surface between toes 1 and 2 .  But also worried about diabetes since her  A1c was 5.8 last Fall and she has also been having blurred vision.  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   lower abdominal discomfort for the past week,  Radiates around both sides to lower back.  Moved /pushed some heavy boxes last week.  But pain is relieved by  Voiding but returns as bladder fills.  Does not drink much water.   History of kidney stones remotely     Podiatry referral  Discussed If labs negative  Recent episode of lower abd discomfort and low back pai n relieved by voiding   Outpatient Medications Prior to Visit  Medication Sig Dispense Refill  . amphetamine-dextroamphetamine (ADDERALL XR) 30 MG 24 hr capsule Take 1 capsule (30 mg total) by mouth daily. 30 capsule 0  . cetirizine (ZYRTEC) 10 MG tablet Take 10 mg by mouth daily as needed for allergies.    . cyclobenzaprine (FLEXERIL) 5 MG tablet Take 1 tablet (5 mg total) by mouth 3 (three) times daily as needed for muscle spasms. Must keep appt in Feb for further refills 30 tablet 0  . diclofenac (VOLTAREN) 75 MG EC tablet TAKE 1 TABLET BY MOUTH TWICE A DAY MUST KEEP APPT IN FEB FOR FURTHER REFILLS 30 tablet 0  . FLUoxetine (PROZAC) 40 MG capsule Take 1 capsule (40 mg total) by mouth daily. 90 capsule 3  . lubiprostone (AMITIZA) 8 MCG capsule Take 8 mcg by mouth 2 (two) times daily as needed for  constipation.     Marland Kitchen omeprazole (PRILOSEC) 40 MG capsule Take 1 capsule (40 mg total) by mouth daily. 90 capsule 1  . sucralfate (CARAFATE) 1 G tablet Take 1 g by mouth 2 (two) times daily.     Marland Kitchen escitalopram (LEXAPRO) 10 MG tablet Take 1 tablet (10 mg total) by mouth daily. (Patient not taking: Reported on 03/17/2018) 30 tablet 3  . lidocaine-prilocaine (EMLA) cream Apply 1 application topically at bedtime. Reported on 09/17/2015    . ranitidine (ZANTAC) 150 MG tablet Take 150 mg by mouth 2 (two) times daily.  4  . tizanidine (ZANAFLEX) 6 MG capsule Take 6 mg by mouth daily as needed for muscle spasms.     No facility-administered medications prior to visit.     Review of Systems;  Patient denies headache, fevers, malaise, unintentional weight loss, skin rash, eye pain, sinus congestion and sinus pain, sore throat, dysphagia,  hemoptysis , cough, dyspnea, wheezing, chest pain, palpitations, orthopnea, edema, abdominal pain, nausea, melena, diarrhea, constipation, flank pain, dysuria, hematuria, urinary  Frequency, nocturia, numbness, tingling, seizures,  Focal weakness, Loss of consciousness,  Tremor, insomnia, depression, anxiety, and suicidal ideation.      Objective:  BP 112/60 (BP Location: Left Arm, Patient Position: Sitting, Cuff Size: Normal)   Pulse 95  Temp (!) 97.5 F (36.4 C) (Oral)   Resp 16   Ht 5\' 5"  (1.651 m)   Wt 131 lb 3.2 oz (59.5 kg)   SpO2 99%   BMI 21.83 kg/m   BP Readings from Last 3 Encounters:  05/16/18 112/60  03/17/18 107/60  02/24/18 122/63    Wt Readings from Last 3 Encounters:  05/16/18 131 lb 3.2 oz (59.5 kg)  03/17/18 125 lb (56.7 kg)  02/24/18 128 lb 6.4 oz (58.2 kg)    General appearance: alert, cooperative and appears stated age Ears: normal TM's and external ear canals both ears Throat: lips, mucosa, and tongue normal; teeth and gums normal Neck: no adenopathy, no carotid bruit, supple, symmetrical, trachea midline and thyroid not  enlarged, symmetric, no tenderness/mass/nodules Back: symmetric, no curvature. ROM normal. No CVA tenderness. Lungs: clear to auscultation bilaterally Heart: regular rate and rhythm, S1, S2 normal, no murmur, click, rub or gallop Abdomen: soft, non-tender; bowel sounds normal; no masses,  no organomegaly Pulses: 2+ and symmetric Skin: Skin color, texture, turgor normal. No rashes or lesions Lymph nodes: Cervical, supraclavicular, and axillary nodes normal.  Lab Results  Component Value Date   HGBA1C 5.8 06/09/2017   HGBA1C 5.8 08/15/2015    Lab Results  Component Value Date   CREATININE 0.9 08/15/2015   CREATININE 0.98 01/17/2015    Lab Results  Component Value Date   WBC 5.9 08/15/2015   HGB 13.6 08/15/2015   HCT 41 08/15/2015   PLT 236 08/15/2015   GLUCOSE 92 01/17/2015   CHOL 229 (H) 09/17/2015   TRIG 282.0 (H) 09/17/2015   HDL 57.80 09/17/2015   LDLDIRECT 125.0 09/17/2015   ALT 13 (L) 01/17/2015   AST 15 08/15/2015   NA 141 08/15/2015   K 4.3 08/15/2015   CL 104 01/17/2015   CREATININE 0.9 08/15/2015   BUN 11 08/15/2015   CO2 28 01/17/2015   HGBA1C 5.8 06/09/2017    No results found.  Assessment & Plan:   Problem List Items Addressed This Visit    Foot pain, right    Not clear if pain is neuropathic or referred from plantar callous/bunion.   If screening labs are normal. Will refer to podiatry       Lower abdominal pain    Recurrent, occurring for years, with prior UTIs noted but most recently no evidence of UTI.  CT in 2017 was normal except for a benign appearing right ovarian cyst.  She is s.p hysterectomy.  Her pain is relieved with emptying of bladder , and recurs with bladder fullness.  If UA and culture are negative for infection ,  I have encouraged her to discuss potential diagnosis of interstitial cystitis.       Prediabetes    Patient is aware of her increased risk but has not changed her high sugar diet.  Low GI diet  Recommended and a1c  repeated         Other Visit Diagnoses    Dysuria    -  Primary   Relevant Orders   POCT Urinalysis Dipstick (Completed)   Urine Microscopic Only   Urine Culture   Numbness and tingling of foot       Relevant Orders   Hemoglobin A1c   TSH   Vitamin B12   RBC Folate     A total of 40 minutes was spent with patient more than half of which was spent in counseling patient on the above mentioned issues , reviewing and explaining  recent labs and imaging studies done, and coordination of care.  I have discontinued Samyiah Krout's lidocaine-prilocaine, ranitidine, escitalopram, and tizanidine. I am also having her maintain her lubiprostone, sucralfate, cetirizine, FLUoxetine, omeprazole, diclofenac, amphetamine-dextroamphetamine, and cyclobenzaprine.  No orders of the defined types were placed in this encounter.   Medications Discontinued During This Encounter  Medication Reason  . escitalopram (LEXAPRO) 10 MG tablet Patient has not taken in last 30 days  . lidocaine-prilocaine (EMLA) cream Patient has not taken in last 30 days  . ranitidine (ZANTAC) 150 MG tablet Patient has not taken in last 30 days  . tizanidine (ZANAFLEX) 6 MG capsule Patient has not taken in last 30 days    Follow-up: No follow-ups on file.   Crecencio Mc, MD

## 2018-05-16 NOTE — Assessment & Plan Note (Addendum)
Recurrent, occurring for years, with prior UTIs noted but most recently no evidence of UTI.  CT in 2017 was normal except for a benign appearing right ovarian cyst.  She is s.p hysterectomy.  Her pain is relieved with emptying of bladder , and recurs with bladder fullness.  If UA and culture are negative for infection ,  I have encouraged her to discuss potential diagnosis of interstitial cystitis.

## 2018-05-17 ENCOUNTER — Other Ambulatory Visit: Payer: Self-pay | Admitting: Family Medicine

## 2018-05-17 LAB — URINALYSIS, MICROSCOPIC ONLY: RBC / HPF: NONE SEEN (ref 0–?)

## 2018-05-17 LAB — URINE CULTURE
MICRO NUMBER:: 91114150
SPECIMEN QUALITY: ADEQUATE

## 2018-05-17 LAB — TSH: TSH: 0.78 u[IU]/mL (ref 0.35–4.50)

## 2018-05-17 LAB — VITAMIN B12: Vitamin B-12: 679 pg/mL (ref 211–911)

## 2018-05-17 LAB — FOLATE RBC: RBC FOLATE: 673 ng/mL (ref >280)

## 2018-05-17 LAB — HEMOGLOBIN A1C: Hgb A1c MFr Bld: 5.7 % (ref 4.6–6.5)

## 2018-05-18 DIAGNOSIS — R102 Pelvic and perineal pain: Secondary | ICD-10-CM | POA: Diagnosis not present

## 2018-05-18 MED ORDER — AMPHETAMINE-DEXTROAMPHET ER 30 MG PO CP24: 30.0000 mg | ORAL_CAPSULE | Freq: Every day | ORAL | 0 refills | Status: DC

## 2018-05-18 NOTE — Telephone Encounter (Signed)
Last OV 05/16/2018   Last refilled 04/04/2018 disp 30 with no refills   Sent to PCP to advise

## 2018-05-18 NOTE — Telephone Encounter (Signed)
Sent to pharmacy.  She needs a follow-up with me for her ADHD.  She can be scheduled in a 4:30 time slot on a Monday, Wednesday, or Friday.  Thanks.

## 2018-05-19 DIAGNOSIS — M542 Cervicalgia: Secondary | ICD-10-CM | POA: Diagnosis not present

## 2018-05-19 DIAGNOSIS — M25512 Pain in left shoulder: Secondary | ICD-10-CM | POA: Diagnosis not present

## 2018-05-19 DIAGNOSIS — M25511 Pain in right shoulder: Secondary | ICD-10-CM | POA: Diagnosis not present

## 2018-05-21 ENCOUNTER — Encounter: Payer: Self-pay | Admitting: Family Medicine

## 2018-05-21 DIAGNOSIS — R300 Dysuria: Secondary | ICD-10-CM

## 2018-05-23 ENCOUNTER — Encounter: Payer: Self-pay | Admitting: Urology

## 2018-05-23 ENCOUNTER — Ambulatory Visit: Payer: BLUE CROSS/BLUE SHIELD | Admitting: Urology

## 2018-05-23 VITALS — BP 116/72 | HR 101 | Ht 66.0 in | Wt 126.0 lb

## 2018-05-23 DIAGNOSIS — R300 Dysuria: Secondary | ICD-10-CM | POA: Diagnosis not present

## 2018-05-23 DIAGNOSIS — R3915 Urgency of urination: Secondary | ICD-10-CM | POA: Diagnosis not present

## 2018-05-23 DIAGNOSIS — R1030 Lower abdominal pain, unspecified: Secondary | ICD-10-CM

## 2018-05-23 DIAGNOSIS — R109 Unspecified abdominal pain: Secondary | ICD-10-CM

## 2018-05-23 DIAGNOSIS — R10A0 Flank pain, unspecified side: Secondary | ICD-10-CM

## 2018-05-23 LAB — URINALYSIS, COMPLETE
Bilirubin, UA: NEGATIVE
GLUCOSE, UA: NEGATIVE
Leukocytes, UA: NEGATIVE
Nitrite, UA: NEGATIVE
PROTEIN UA: NEGATIVE
RBC, UA: NEGATIVE
Specific Gravity, UA: 1.025 (ref 1.005–1.030)
UUROB: 0.2 mg/dL (ref 0.2–1.0)
pH, UA: 5.5 (ref 5.0–7.5)

## 2018-05-23 LAB — MICROSCOPIC EXAMINATION: WBC, UA: NONE SEEN /hpf (ref 0–5)

## 2018-05-23 LAB — BLADDER SCAN AMB NON-IMAGING: Scan Result: 12

## 2018-05-23 MED ORDER — MIRABEGRON ER 25 MG PO TB24: 25.0000 mg | ORAL_TABLET | Freq: Every day | ORAL | 0 refills | Status: DC

## 2018-05-23 NOTE — Progress Notes (Signed)
05/23/2018 4:48 PM   Suzanne Cruz 08/14/1966 [id number]  Referring provider: Leone Haven, MD [address] [place], [place] [address]  No chief complaint on file.   HPI: Patient is a 52 year old Caucasian female with a history of urge incontinence, dysuria, nephrolithiasis who presents today as a return referral from Dr. Caryl Bis and Dr. Derrel Nip.  Contrast CT in 2017 did not demonstrate any abnormalities with the kidneys or bladder.    She is having frequency x 4, strong urgency, pain after urinating in the suprapubic region, nocturia x 1-2 and intermittency.  These symptoms have been occurring for awhile, but it has gotten worse over the last two weeks.  She is having suprapubic pain and left flank pain.  10/10 pain.  Pain last for 5 minutes.  She is having nausea and night sweats.  Patient denies any gross hematuria, dysuria or suprapubic/flank pain.  Patient denies any fevers, chills, nausea or vomiting.   She was just started on a low dose OCP by her gynecologist for endometriosis.    She states she has a remote history of stones.  ESWL in 1993.  Stone composition is unknown.    Her UA is positive for moderate bacteria.  Her PVR is 12 mL.     PMH: Past Medical History:  Diagnosis Date  . Chicken pox   . Colon polyps 2015  . Cyst of left kidney   . Depression   . Dysphagia   . Endometriosis   . GERD (gastroesophageal reflux disease)   . History of gastritis 09/17/2015  . History of kidney stones   . History of stomach ulcers   . IBS (irritable bowel syndrome)   . Migraines     Surgical History: Past Surgical History:  Procedure Laterality Date  . ABDOMINAL HYSTERECTOMY  2001   partial only right ovary is the only thing she as.  Marland Kitchen BREAST SURGERY Left 2005   biopsy  . BUNIONECTOMY Left 2004  . COLONOSCOPY WITH PROPOFOL N/A 03/17/2018   Procedure: COLONOSCOPY WITH PROPOFOL;  Surgeon: [name], MD;  Location[facility] ENDOSCOPY;  Service:  Endoscopy;  Laterality: N/A;  . ESOPHAGOGASTRODUODENOSCOPY (EGD) WITH PROPOFOL N/A 02/10/2015   Procedure: ESOPHAGOGASTRODUODENOSCOPY (EGD) WITH PROPOFOL;  Surgeon: [name], MD;  Location: [facility] ENDOSCOPY;  Service: Endoscopy;  Laterality: N/A;  . ESOPHAGOGASTRODUODENOSCOPY (EGD) WITH PROPOFOL N/A 03/17/2018   Procedure: ESOPHAGOGASTRODUODENOSCOPY (EGD) WITH PROPOFOL;  Surgeon: [name], MD;  Location: [facility] ENDOSCOPY;  Service: Endoscopy;  Laterality: N/A;  . LITHOTRIPSY  1993    Home Medications:  Allergies as of 05/23/2018      Reactions   Sulfa Antibiotics Rash      Medication List        Accurate as of 05/23/18  4:48 PM. Always use your most recent med list.          amphetamine-dextroamphetamine 30 MG 24 hr capsule Commonly known as:  ADDERALL XR Take 1 capsule (30 mg total) by mouth daily.   cetirizine 10 MG tablet Commonly known as:  ZYRTEC Take 10 mg by mouth daily as needed for allergies.   chlorzoxazone 250 MG tablet Commonly known as:  PARAFON FORTE Take 250 mg by mouth 4 (four) times daily as needed for muscle spasms.   cyclobenzaprine 5 MG tablet Commonly known as:  FLEXERIL Take 1 tablet (5 mg total) by mouth 3 (three) times daily as needed for muscle spasms. Must keep appt in Feb for further refills  diclofenac 75 MG EC tablet Commonly known as:  VOLTAREN TAKE 1 TABLET BY MOUTH TWICE A DAY MUST KEEP APPT IN FEB FOR FURTHER REFILLS   lubiprostone 8 MCG capsule Commonly known as:  AMITIZA Take 8 mcg by mouth 2 (two) times daily as needed for constipation.   mirabegron ER 25 MG Tb24 tablet Commonly known as:  MYRBETRIQ Take 1 tablet (25 mg total) by mouth daily.   omeprazole 40 MG capsule Commonly known as:  PRILOSEC Take 1 capsule (40 mg total) by mouth daily.   sucralfate 1 g tablet Commonly known as:  CARAFATE Take 1 g by mouth 2 (two) times daily.       Allergies:  Allergies  Allergen Reactions  . Sulfa Antibiotics Rash     Family History: Family History  Problem Relation Age of Onset  . Sudden death Brother   . Hypertension Mother   . Colon polyps Mother   . Hypertension Brother   . Stroke Paternal Grandmother     Social History:  reports that she has never smoked. She has never used smokeless tobacco. She reports that she drinks alcohol. She reports that she does not use drugs.  ROS: UROLOGY Frequent Urination?: Yes Hard to postpone urination?: Yes Burning/pain with urination?: Yes Get up at night to urinate?: Yes Leakage of urine?: No Urine stream starts and stops?: No Trouble starting stream?: No Do you have to strain to urinate?: No Blood in urine?: No Urinary tract infection?: Yes Sexually transmitted disease?: No Injury to kidneys or bladder?: No Painful intercourse?: No Weak stream?: No Currently pregnant?: No Vaginal bleeding?: No Last menstrual period?: n  Gastrointestinal Nausea?: Yes Vomiting?: No Indigestion/heartburn?: No Diarrhea?: Yes Constipation?: No  Constitutional Fever: No Night sweats?: Yes Weight loss?: No Fatigue?: Yes  Skin Skin rash/lesions?: No Itching?: No  Eyes Blurred vision?: No Double vision?: No  Ears/Nose/Throat Sore throat?: No Sinus problems?: No  Hematologic/Lymphatic Swollen glands?: No Easy bruising?: No  Cardiovascular Leg swelling?: No Chest pain?: No  Respiratory Cough?: No Shortness of breath?: No  Endocrine Excessive thirst?: Yes  Musculoskeletal Back pain?: No Joint pain?: No  Neurological Headaches?: No Dizziness?: No  Psychologic Depression?: No Anxiety?: No  Physical Exam: BP 116/72 (BP Location: Left Arm, Patient Position: Sitting, Cuff Size: Normal)   Pulse (!) 101   Ht 5\' 6"  (1.676 m)   Wt 126 lb (57.2 kg)   BMI 20.34 kg/m   Constitutional: Well nourished. Alert and oriented, No acute distress. HEENT: [place] AT, moist mucus membranes. Trachea midline, no masses. Cardiovascular: No clubbing,  cyanosis, or edema. Respiratory: Normal respiratory effort, no increased work of breathing. GI: Abdomen is soft, non tender, non distended, no abdominal masses. Liver and spleen not palpable.  No hernias appreciated.  Stool sample for occult testing is not indicated.   GU: No CVA tenderness.  No bladder fullness or masses.  Normal external genitalia, normal pubic hair distribution, no lesions.  Normal urethral meatus, no lesions, no prolapse, no discharge.   No urethral masses, tenderness and/or tenderness. No bladder fullness or masses.  Bladder is tender to palpation.  Normal vagina mucosa, good estrogen effect, no discharge, no lesions, good pelvic support, no cystocele or rectocele noted.  Cervix and uterus are surgically absent.  No adnexal/parametria masses or tenderness noted.  Anus and perineum are without rashes or lesions.    Skin: No rashes, bruises or suspicious lesions. Lymph: No cervical or inguinal adenopathy. Neurologic: Grossly intact, no focal deficits, moving all 4  extremities. Psychiatric: Normal mood and affect.  Laboratory Data:  Urinalysis Moderate bacteria  See EPIC.    I have reviewed the labs.   Pertinent Imaging:  Results for Suzanne, Cruz (MRN [id number]) as of 05/23/2018 16:35  Ref. Range 05/23/2018 16:02  Scan Result Unknown 12    Assessment & Plan:    1. Left flank pain Remote history of stones Will obtain a CT Renal stone study at this time Patient is advised that if they should start to experience pain that is not able to be controlled with pain medication, intractable nausea and/or vomiting and/or fevers greater than 103 or shaking chills to contact the office immediately or seek treatment in the emergency department for emergent intervention.    2. Suprapubic pain Occurs after urination UA with moderate bacteria will send for culture to rule out infection -we will hold on prescribing antibiotic until culture results are available  3. Urgency Given  samples of Myrbetriq 25 mg daily, # 28 samples  Will reassess when she returns for CT report  Return for CT report.  These notes generated with voice recognition software. I apologize for typographical errors.  Zara Council, PA-C  [facility] [address] [place],  [address] [phone]

## 2018-05-25 ENCOUNTER — Ambulatory Visit: Payer: BLUE CROSS/BLUE SHIELD | Admitting: Urology

## 2018-05-25 ENCOUNTER — Ambulatory Visit
Admission: RE | Admit: 2018-05-25 | Discharge: 2018-05-25 | Disposition: A | Discharge status: Home / Self Care | Payer: BLUE CROSS/BLUE SHIELD | Source: Ambulatory Visit | Attending: Urology | Admitting: Urology

## 2018-05-25 DIAGNOSIS — R1030 Lower abdominal pain, unspecified: Secondary | ICD-10-CM

## 2018-05-25 DIAGNOSIS — K5730 Diverticulosis of large intestine without perforation or abscess without bleeding: Secondary | ICD-10-CM | POA: Diagnosis not present

## 2018-05-25 DIAGNOSIS — N200 Calculus of kidney: Secondary | ICD-10-CM | POA: Diagnosis not present

## 2018-05-26 ENCOUNTER — Ambulatory Visit (INDEPENDENT_AMBULATORY_CARE_PROVIDER_SITE_OTHER): Payer: BLUE CROSS/BLUE SHIELD | Admitting: Urology

## 2018-05-26 ENCOUNTER — Encounter: Payer: Self-pay | Admitting: Urology

## 2018-05-26 VITALS — BP 119/63 | HR 86 | Ht 66.0 in | Wt 126.0 lb

## 2018-05-26 DIAGNOSIS — R300 Dysuria: Secondary | ICD-10-CM | POA: Diagnosis not present

## 2018-05-26 LAB — CULTURE, URINE COMPREHENSIVE

## 2018-05-26 NOTE — Progress Notes (Signed)
05/26/2018 8:25 PM   Suzanne Cruz 04-Oct-1965 [id number]  Referring provider: Leone Haven, MD [address] [place], [place] [address]  Chief Complaint  Patient presents with  . Results    CTscan Report    HPI: Patient is a 52 year old Caucasian female with a history of urge incontinence, dysuria, nephrolithiasis who presents today to discuss her CT renal stone study results.  Background history  Referral from Dr. Caryl Bis and Dr. Derrel Nip.  Contrast CT in 2017 did not demonstrate any abnormalities with the kidneys or bladder.  She is having frequency x 4, strong urgency, pain after urinating in the suprapubic region, nocturia x 1-2 and intermittency.  These symptoms have been occurring for awhile, but it has gotten worse over the last two weeks.  She is having suprapubic pain and left flank pain.  10/10 pain.  Pain last for 5 minutes.  She is having nausea and night sweats.  Patient denies any gross hematuria, dysuria or suprapubic/flank pain.  Patient denies any fevers, chills, nausea or vomiting.  She was just started on a low dose OCP by her gynecologist for endometriosis.  She states she has a remote history of stones.  ESWL in 1993.  Stone composition is unknown.  Her UA is positive for moderate bacteria.  Urine culture was negative. Her PVR is 12 mL.    CT Renal stone study on 05/25/2018 revealed No evidence of urolithiasis, hydronephrosis, or other acute findings.  Colonic diverticulosis, without radiographic evidence of diverticulitis.  Today, patient is complaining of frequency, burning in the suprapubic area, diarrhea and fatigue.  Patient denies any gross hematuria, dysuria or suprapubic/flank pain.  Patient denies any fevers, chills, nausea or vomiting.   PMH: Past Medical History:  Diagnosis Date  . Chicken pox   . Colon polyps 2015  . Cyst of left kidney   . Depression   . Dysphagia   . Endometriosis   . GERD (gastroesophageal reflux disease)   .  History of gastritis 09/17/2015  . History of kidney stones   . History of stomach ulcers   . IBS (irritable bowel syndrome)   . Migraines     Surgical History: Past Surgical History:  Procedure Laterality Date  . ABDOMINAL HYSTERECTOMY  2001   partial only right ovary is the only thing she as.  Marland Kitchen BREAST SURGERY Left 2005   biopsy  . BUNIONECTOMY Left 2004  . COLONOSCOPY WITH PROPOFOL N/A 03/17/2018   Procedure: COLONOSCOPY WITH PROPOFOL;  Surgeon: [name], MD;  Location: [facility] ENDOSCOPY;  Service: Endoscopy;  Laterality: N/A;  . ESOPHAGOGASTRODUODENOSCOPY (EGD) WITH PROPOFOL N/A 02/10/2015   Procedure: ESOPHAGOGASTRODUODENOSCOPY (EGD) WITH PROPOFOL;  Surgeon: [name], MD;  Location: [facility] ENDOSCOPY;  Service: Endoscopy;  Laterality: N/A;  . ESOPHAGOGASTRODUODENOSCOPY (EGD) WITH PROPOFOL N/A 03/17/2018   Procedure: ESOPHAGOGASTRODUODENOSCOPY (EGD) WITH PROPOFOL;  Surgeon: [name], MD;  Location: [facility] ENDOSCOPY;  Service: Endoscopy;  Laterality: N/A;  . LITHOTRIPSY  1993    Home Medications:  Allergies as of 05/26/2018      Reactions   Sulfa Antibiotics Rash      Medication List        Accurate as of 05/26/18 11:59 PM. Always use your most recent med list.          amphetamine-dextroamphetamine 30 MG 24 hr capsule Commonly known as:  ADDERALL XR Take 1 capsule (30 mg total) by mouth daily.   cetirizine 10 MG tablet Commonly known as:  ZYRTEC  Take 10 mg by mouth daily as needed for allergies.   chlorzoxazone 250 MG tablet Commonly known as:  PARAFON FORTE Take 250 mg by mouth 4 (four) times daily as needed for muscle spasms.   cyclobenzaprine 5 MG tablet Commonly known as:  FLEXERIL Take 1 tablet (5 mg total) by mouth 3 (three) times daily as needed for muscle spasms. Must keep appt in Feb for further refills   diclofenac 75 MG EC tablet Commonly known as:  VOLTAREN TAKE 1 TABLET BY MOUTH TWICE A DAY MUST KEEP APPT IN FEB FOR FURTHER  REFILLS   lubiprostone 8 MCG capsule Commonly known as:  AMITIZA Take 8 mcg by mouth 2 (two) times daily as needed for constipation.   mirabegron ER 25 MG Tb24 tablet Commonly known as:  MYRBETRIQ Take 1 tablet (25 mg total) by mouth daily.   omeprazole 40 MG capsule Commonly known as:  PRILOSEC Take 1 capsule (40 mg total) by mouth daily.   sucralfate 1 g tablet Commonly known as:  CARAFATE Take 1 g by mouth 2 (two) times daily.       Allergies:  Allergies  Allergen Reactions  . Sulfa Antibiotics Rash    Family History: Family History  Problem Relation Age of Onset  . Sudden death Brother   . Hypertension Mother   . Colon polyps Mother   . Hypertension Brother   . Stroke Paternal Grandmother     Social History:  reports that she has never smoked. She has never used smokeless tobacco. She reports that she drinks alcohol. She reports that she does not use drugs.  ROS: UROLOGY Frequent Urination?: Yes Hard to postpone urination?: No Burning/pain with urination?: Yes Get up at night to urinate?: No Leakage of urine?: No Urine stream starts and stops?: No Trouble starting stream?: No Do you have to strain to urinate?: No Blood in urine?: No Urinary tract infection?: No Sexually transmitted disease?: No Injury to kidneys or bladder?: No Painful intercourse?: No Weak stream?: No Currently pregnant?: No Vaginal bleeding?: No Last menstrual period?: n  Gastrointestinal Nausea?: No Vomiting?: No Indigestion/heartburn?: No Diarrhea?: Yes Constipation?: No  Constitutional Fever: No Night sweats?: No Weight loss?: No Fatigue?: Yes  Skin Skin rash/lesions?: No Itching?: No  Eyes Blurred vision?: No Double vision?: No  Ears/Nose/Throat Sore throat?: No Sinus problems?: No  Hematologic/Lymphatic Swollen glands?: No Easy bruising?: No  Cardiovascular Leg swelling?: No Chest pain?: No  Respiratory Cough?: No Shortness of breath?:  No  Endocrine Excessive thirst?: No  Musculoskeletal Back pain?: No Joint pain?: No  Neurological Headaches?: No Dizziness?: No  Psychologic Depression?: No Anxiety?: No  Physical Exam: BP 119/63   Pulse 86   Ht 5\' 6"  (1.676 m)   Wt 126 lb (57.2 kg)   BMI 20.34 kg/m   Constitutional: Well nourished. Alert and oriented, No acute distress. HEENT: [place] AT, moist mucus membranes. Trachea midline, no masses. Cardiovascular: No clubbing, cyanosis, or edema. Respiratory: Normal respiratory effort, no increased work of breathing. Skin: No rashes, bruises or suspicious lesions. Lymph: No cervical or inguinal adenopathy. Neurologic: Grossly intact, no focal deficits, moving all 4 extremities. Psychiatric: Normal mood and affect.  Laboratory Data: None today.   Pertinent Imaging:  CLINICAL DATA:  Left flank pain for 4 weeks.  Nephrolithiasis.  EXAM: CT ABDOMEN AND PELVIS WITHOUT CONTRAST  TECHNIQUE: Multidetector CT imaging of the abdomen and pelvis was performed following the standard protocol without IV contrast.  COMPARISON:  11/20/2015  FINDINGS: Lower chest:  No acute findings.  Hepatobiliary: No mass visualized on this unenhanced exam. Gallbladder is unremarkable.  Pancreas: No mass or inflammatory process visualized on this unenhanced exam.  Spleen:  Within normal limits in size.  Adrenals/Urinary tract: No evidence of urolithiasis or hydronephrosis. Unremarkable unopacified urinary bladder.  Stomach/Bowel: No evidence of obstruction, inflammatory process, or abnormal fluid collections. Mild diverticulosis is seen involving the descending and proximal sigmoid colon, however there is no evidence of diverticulitis.  Vascular/Lymphatic: No pathologically enlarged lymph nodes identified. No evidence of abdominal aortic aneurysm.  Reproductive: Prior hysterectomy noted. Adnexal regions are unremarkable in appearance.  Other:   None.  Musculoskeletal:  No suspicious bone lesions identified.  IMPRESSION: No evidence of urolithiasis, hydronephrosis, or other acute findings.  Colonic diverticulosis, without radiographic evidence of diverticulitis.   Electronically Signed   By: Earle Gell M.D.   On: 05/25/2018 14:19 I have independently reviewed the films  Assessment & Plan:    1. Left flank pain Remote history of stones CT renal stone study revealed no stones or hydronephrosis  2. Suprapubic pain Recommended a cystoscopy at this time, but patient deferred Will refer to PT for further evaluation  3. Urgency Given samples of Myrbetriq 25 mg daily, # 28 samples - she has not taken the samples   Return for follow up with PT .  These notes generated with voice recognition software. I apologize for typographical errors.  Zara Council, PA-C  [facility] [address] [place], [place] [address] [phone]

## 2018-05-28 ENCOUNTER — Other Ambulatory Visit: Payer: Self-pay | Admitting: Urology

## 2018-05-28 DIAGNOSIS — R1030 Lower abdominal pain, unspecified: Secondary | ICD-10-CM

## 2018-05-28 NOTE — Progress Notes (Signed)
PT referral in the chart.

## 2018-05-29 DIAGNOSIS — H18453 Nodular corneal degeneration, bilateral: Secondary | ICD-10-CM | POA: Diagnosis not present

## 2018-05-29 DIAGNOSIS — H01021 Squamous blepharitis right upper eyelid: Secondary | ICD-10-CM | POA: Diagnosis not present

## 2018-06-02 DIAGNOSIS — M5030 Other cervical disc degeneration, unspecified cervical region: Secondary | ICD-10-CM | POA: Diagnosis not present

## 2018-06-02 DIAGNOSIS — M25512 Pain in left shoulder: Secondary | ICD-10-CM | POA: Diagnosis not present

## 2018-06-02 DIAGNOSIS — M542 Cervicalgia: Secondary | ICD-10-CM | POA: Diagnosis not present

## 2018-06-02 DIAGNOSIS — M25511 Pain in right shoulder: Secondary | ICD-10-CM | POA: Diagnosis not present

## 2018-06-09 DIAGNOSIS — M25511 Pain in right shoulder: Secondary | ICD-10-CM | POA: Diagnosis not present

## 2018-06-09 DIAGNOSIS — M25512 Pain in left shoulder: Secondary | ICD-10-CM | POA: Diagnosis not present

## 2018-06-09 DIAGNOSIS — M542 Cervicalgia: Secondary | ICD-10-CM | POA: Diagnosis not present

## 2018-06-16 ENCOUNTER — Ambulatory Visit: Payer: Self-pay | Admitting: *Deleted

## 2018-06-16 NOTE — Telephone Encounter (Signed)
Called patient back because I could not see where she has been evaluated, patient stat she stated she will ed well the pain in my chest comes and goes , Advised patient the only to rule out her heart is to be evaluated .

## 2018-06-16 NOTE — Telephone Encounter (Signed)
Patient has had upper left back pain, chest pain- indigestion- feels like something in throat, palpations, severe stomach pain. Patient took Prilosec last night and this morning the pain is back. Patient does have a hiatal hernia. Patient has been worked through protocol and she has been directed to go to ED for evaluation of her chest pain. She did have concerns that these symptoms could be heart related and we can not rule that out. She is agreeable to go.  Reason for Disposition . [1] Intermittent  chest pain or "angina" AND [2] increasing in severity or frequency  (Exception: pains lasting a few seconds)  Answer Assessment - Initial Assessment Questions 1. LOCATION: "Where does it hurt?"       Left side- heart area to the back 2. RADIATION: "Does the pain go anywhere else?" (e.g., into neck, jaw, arms, back)     Back, last might felt like it was going into arm 3. ONSET: "When did the chest pain begin?" (Minutes, hours or days)      Started Monday/Tuesday- pain this morning 4. PATTERN "Does the pain come and go, or has it been constant since it started?"  "Does it get worse with exertion?"      Comes and goes, gets worse with exertion 5. DURATION: "How long does it last" (e.g., seconds, minutes, hours)     Last night- 15 minutes, this morning- 10-15 minutes- still has lingering vague discomfort 6. SEVERITY: "How bad is the pain?"  (e.g., Scale 1-10; mild, moderate, or severe)    - MILD (1-3): doesn't interfere with normal activities     - MODERATE (4-7): interferes with normal activities or awakens from sleep    - SEVERE (8-10): excruciating pain, unable to do any normal activities       At start- stomach pain was severe and chest pain was 7 7. CARDIAC RISK FACTORS: "Do you have any history of heart problems or risk factors for heart disease?" (e.g., prior heart attack, angina; high blood pressure, diabetes, being overweight, high cholesterol, smoking, or strong family history of heart  disease)     Mitral valve 8. PULMONARY RISK FACTORS: "Do you have any history of lung disease?"  (e.g., blood clots in lung, asthma, emphysema, birth control pills)     no 9. CAUSE: "What do you think is causing the chest pain?"     unknown 10. OTHER SYMPTOMS: "Do you have any other symptoms?" (e.g., dizziness, nausea, vomiting, sweating, fever, difficulty breathing, cough)       Nausea, dizziness- off balance 11. PREGNANCY: "Is there any chance you are pregnant?" "When was your last menstrual period?"       n/a  Protocols used: CHEST PAIN-A-AH

## 2018-06-17 ENCOUNTER — Other Ambulatory Visit: Payer: Self-pay

## 2018-06-17 ENCOUNTER — Emergency Department: Payer: BLUE CROSS/BLUE SHIELD

## 2018-06-17 ENCOUNTER — Encounter: Payer: Self-pay | Admitting: Emergency Medicine

## 2018-06-17 ENCOUNTER — Emergency Department
Admission: EM | Admit: 2018-06-17 | Discharge: 2018-06-17 | Disposition: A | Discharge status: Home / Self Care | Payer: BLUE CROSS/BLUE SHIELD | Attending: Emergency Medicine | Admitting: Emergency Medicine

## 2018-06-17 DIAGNOSIS — Z79899 Other long term (current) drug therapy: Secondary | ICD-10-CM | POA: Insufficient documentation

## 2018-06-17 DIAGNOSIS — R079 Chest pain, unspecified: Secondary | ICD-10-CM | POA: Diagnosis not present

## 2018-06-17 DIAGNOSIS — F900 Attention-deficit hyperactivity disorder, predominantly inattentive type: Secondary | ICD-10-CM | POA: Insufficient documentation

## 2018-06-17 DIAGNOSIS — R0789 Other chest pain: Secondary | ICD-10-CM | POA: Diagnosis not present

## 2018-06-17 DIAGNOSIS — F329 Major depressive disorder, single episode, unspecified: Secondary | ICD-10-CM | POA: Insufficient documentation

## 2018-06-17 LAB — BASIC METABOLIC PANEL
ANION GAP: 8 (ref 5–15)
BUN: 19 mg/dL (ref 6–20)
CHLORIDE: 104 mmol/L (ref 98–111)
CO2: 28 mmol/L (ref 22–32)
Calcium: 9.5 mg/dL (ref 8.9–10.3)
Creatinine, Ser: 0.94 mg/dL (ref 0.44–1.00)
GFR calc non Af Amer: >60 mL/min (ref >60)
GLUCOSE: 103 mg/dL — AB (ref 70–99)
POTASSIUM: 3.9 mmol/L (ref 3.5–5.1)
Sodium: 140 mmol/L (ref 135–145)

## 2018-06-17 LAB — CBC
HEMATOCRIT: 42.9 % (ref 36.0–46.0)
HEMOGLOBIN: 13.9 g/dL (ref 12.0–15.0)
MCH: 28.5 pg (ref 26.0–34.0)
MCHC: 32.4 g/dL (ref 30.0–36.0)
MCV: 87.9 fL (ref 80.0–100.0)
NRBC: 0 % (ref 0.0–0.2)
PLATELETS: 205 10*3/uL (ref 150–400)
RBC: 4.88 MIL/uL (ref 3.87–5.11)
RDW: 12.1 % (ref 11.5–15.5)
WBC: 5.8 10*3/uL (ref 4.0–10.5)

## 2018-06-17 LAB — HEPATIC FUNCTION PANEL
ALBUMIN: 4.2 g/dL (ref 3.5–5.0)
ALK PHOS: 67 U/L (ref 38–126)
ALT: 14 U/L (ref 0–44)
AST: 15 U/L (ref 15–41)
BILIRUBIN TOTAL: 0.6 mg/dL (ref 0.3–1.2)
Bilirubin, Direct: <0.1 mg/dL (ref 0.0–0.2)
Total Protein: 7.5 g/dL (ref 6.5–8.1)

## 2018-06-17 LAB — TROPONIN I: Troponin I: <0.03 ng/mL (ref <0.03)

## 2018-06-17 LAB — LIPASE, BLOOD: Lipase: 46 U/L (ref 11–51)

## 2018-06-17 MED ORDER — SUCRALFATE 1 GM/10ML PO SUSP: 1.0000 g | Freq: Four times a day (QID) | ORAL | 0 refills | Status: DC

## 2018-06-17 NOTE — ED Notes (Signed)
Patient declined 2nd Troponin draw. Dr. Cherylann Banas aware and at bedside.

## 2018-06-17 NOTE — ED Triage Notes (Signed)
Here for intermittent central chest pain radiating to back for last week.  Hx hiatal hernia. No respiratory distress noted. Ambulatory. Unlabored.  Some nausea but no other symptoms noted.

## 2018-06-17 NOTE — ED Provider Notes (Signed)
[facility] Emergency Department Provider Note ____________________________________________   First MD Initiated Contact with Patient 06/17/18 1126     (approximate)  I have reviewed the triage vital signs and the nursing notes.   HISTORY  Chief Complaint Chest Pain    HPI Suzanne Cruz is a 52 y.o. female with PMH as noted below who presents with chest pain over the last week, intermittent, nonexertional, and sometimes radiating to her back or her upper abdomen.  She denies associated shortness of breath.  She reports some mild intermittent lightheadedness.  She denies nausea or vomiting.  No prior history of this pain.  The patient does have a hiatal hernia and takes Prilosec.   Past Medical History:  Diagnosis Date  . Chicken pox   . Colon polyps 2015  . Cyst of left kidney   . Depression   . Dysphagia   . Endometriosis   . GERD (gastroesophageal reflux disease)   . History of gastritis 09/17/2015  . History of kidney stones   . History of stomach ulcers   . IBS (irritable bowel syndrome)   . Migraines     Patient Active Problem List   Diagnosis Date Noted  . Foot pain, right 05/16/2018  . Lower abdominal pain 05/16/2018  . Prediabetes 05/16/2018  . Vision changes 06/09/2017  . Urge incontinence 03/11/2017  . Pelvic pain 09/30/2016  . IBS (irritable bowel syndrome)   . Anxiety and depression 04/09/2016  . Adult ADHD 09/18/2015  . 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 GERD (gastroesophageal reflux disease) 09/17/2015  . Personal history of other diseases of the digestive system 09/17/2015  . Dysphagia 02/03/2015    Past Surgical History:  Procedure Laterality Date  . ABDOMINAL HYSTERECTOMY  2001   partial only right ovary is the only thing she as.  Marland Kitchen BREAST SURGERY Left 2005   biopsy  . BUNIONECTOMY Left 2004  . COLONOSCOPY WITH PROPOFOL N/A 03/17/2018   Procedure: COLONOSCOPY WITH  PROPOFOL;  Surgeon: [name], MD;  Location: [facility] ENDOSCOPY;  Service: Endoscopy;  Laterality: N/A;  . ESOPHAGOGASTRODUODENOSCOPY (EGD) WITH PROPOFOL N/A 02/10/2015   Procedure: ESOPHAGOGASTRODUODENOSCOPY (EGD) WITH PROPOFOL;  Surgeon: [name], MD;  Location: [facility] ENDOSCOPY;  Service: Endoscopy;  Laterality: N/A;  . ESOPHAGOGASTRODUODENOSCOPY (EGD) WITH PROPOFOL N/A 03/17/2018   Procedure: ESOPHAGOGASTRODUODENOSCOPY (EGD) WITH PROPOFOL;  Surgeon: [name], MD;  Location: [facility] ENDOSCOPY;  Service: Endoscopy;  Laterality: N/A;  . LITHOTRIPSY  1993    Prior to Admission medications   Medication Sig Start Date End Date Taking? Authorizing Provider  amphetamine-dextroamphetamine (ADDERALL XR) 30 MG 24 hr capsule Take 1 capsule (30 mg total) by mouth daily. 05/18/18   Leone Haven, MD  cetirizine (ZYRTEC) 10 MG tablet Take 10 mg by mouth daily as needed for allergies.    [provider]  chlorzoxazone (PARAFON FORTE) 250 MG tablet Take 250 mg by mouth 4 (four) times daily as needed for muscle spasms.    [provider]  cyclobenzaprine (FLEXERIL) 5 MG tablet Take 1 tablet (5 mg total) by mouth 3 (three) times daily as needed for muscle spasms. Must keep appt in Feb for further refills 04/07/18   Leone Haven, MD  diclofenac (VOLTAREN) 75 MG EC tablet TAKE 1 TABLET BY MOUTH TWICE A DAY MUST KEEP APPT IN FEB FOR FURTHER REFILLS 02/23/18   Leone Haven, MD  lubiprostone (AMITIZA) 8 MCG capsule Take 8 mcg by mouth 2 (two)  times daily as needed for constipation.     [provider]  mirabegron ER (MYRBETRIQ) 25 MG TB24 tablet Take 1 tablet (25 mg total) by mouth daily. 05/23/18   Zara Council A, PA-C  omeprazole (PRILOSEC) 40 MG capsule Take 1 capsule (40 mg total) by mouth daily. 09/30/16   Coral Spikes, DO  sucralfate (CARAFATE) 1 GM/10ML suspension Take 10 mLs (1 g total) by mouth 4 (four) times daily for 7 days. 06/17/18 06/24/18   Arta Silence, MD    Allergies Sulfa antibiotics  Family History  Problem Relation Age of Onset  . Sudden death Brother   . Hypertension Mother   . Colon polyps Mother   . Hypertension Brother   . Stroke Paternal Grandmother     Social History Social History   Tobacco Use  . Smoking status: Never Smoker  . Smokeless tobacco: Never Used  Substance Use Topics  . Alcohol use: Yes    Alcohol/week: 0.0 - 1.0 standard drinks  . Drug use: No    Review of Systems  Constitutional: No fever. Eyes: No redness. ENT: No neck pain. Cardiovascular: Positive for intermittent chest pain. Respiratory: Denies shortness of breath. Gastrointestinal: No vomiting.  Genitourinary: Negative for flank pain.  Musculoskeletal: Negative for back pain. Skin: Negative for rash. Neurological: Negative for headache.   ____________________________________________   PHYSICAL EXAM:  VITAL SIGNS: ED Triage Vitals  Enc Vitals Group     BP 06/17/18 1048 (!) 118/58     Pulse Rate 06/17/18 1048 79     Resp 06/17/18 1048 20     Temp 06/17/18 1048 98 F (36.7 C)     Temp Source 06/17/18 1048 Oral     SpO2 06/17/18 1048 100 %     Weight 06/17/18 1043 126 lb (57.2 kg)     Height 06/17/18 1043 5\' 6"  (1.676 m)     Head Circumference --      Peak Flow --      Pain Score 06/17/18 1043 2     Pain Loc --      Pain Edu? --      Excl. in [place]? --     Constitutional: Alert and oriented.  Relatively well appearing and in no acute distress. Eyes: Conjunctivae are normal.  Head: Atraumatic. Nose: No congestion/rhinnorhea. Mouth/Throat: Mucous membranes are moist.   Neck: Normal range of motion.  Cardiovascular: Normal rate, regular rhythm. Grossly normal heart sounds.  Good peripheral circulation. Respiratory: Normal respiratory effort.  No retractions. Lungs CTAB. Gastrointestinal: Soft and nontender. No distention.  Genitourinary: No flank tenderness. Musculoskeletal: No lower extremity  edema.  Extremities warm and well perfused.  No calf or popliteal swelling or tenderness. Neurologic:  Normal speech and language. No gross focal neurologic deficits are appreciated.  Skin:  Skin is warm and dry. No rash noted. Psychiatric: Mood and affect are normal. Speech and behavior are normal.  ____________________________________________   LABS (all labs ordered are listed, but only abnormal results are displayed)  Labs Reviewed  BASIC METABOLIC PANEL - Abnormal; Notable for the following components:      Result Value   Glucose, Bld 103 (*)    All other components within normal limits  CBC  TROPONIN I  HEPATIC FUNCTION PANEL  LIPASE, BLOOD  TROPONIN I   ____________________________________________  EKG  ED ECG REPORT I, Arta Silence, the attending physician, personally viewed and interpreted this ECG.  Date: 06/17/2018 EKG Time: 1042 Rate: 75 Rhythm: normal sinus rhythm QRS Axis:  normal Intervals: normal ST/T Wave abnormalities: normal Narrative Interpretation: no evidence of acute ischemia  ____________________________________________  RADIOLOGY  CXR: No focal infiltrate or other acute abnormality.  Normal mediastinum.  ____________________________________________   PROCEDURES  Procedure(s) performed: No  Procedures  Critical Care performed: No ____________________________________________   INITIAL IMPRESSION / ASSESSMENT AND PLAN / ED COURSE  Pertinent labs & imaging results that were available during my care of the patient were reviewed by me and considered in my medical decision making (see chart for details).  52 year old female with no prior CAD history presents with atypical, intermittent and nonexertional chest pain over the last week associated with some upper abdominal discomfort and sometimes radiated to her back.  She has a known history of hiatal hernia.  On exam the patient is well-appearing.  Her vital signs are normal.  Her  abdomen is soft and nontender.  Lungs are clear.  The remainder of the exam is unremarkable.  EKG is nonischemic.  Overall I suspect most likely GI related etiology such as GERD or gastritis, likely exacerbated by the hiatal hernia.  I have a lower suspicion for ACS given the lack of history or risk factors.  Although the patient cannot be ruled out by [facility] due to her age, there is no clinical evidence for DVT or PE and no risk factors.  The patient does describe the pain sometimes radiating to the back but her presentation is otherwise inconsistent with aortic dissection or other vascular etiology given her normal blood pressure, lack of tachycardia, intermittent nature of the pain, and normal chest x-ray.  We will obtain basic and hepatobiliary labs, cardiac enzymes, and reassess.  ----------------------------------------- 1:52 PM on 06/17/2018 -----------------------------------------  The patient's lab work-up is within normal limits.  She continues to be asymptomatic.  I had suggested doing a second troponin to more fully rule out ACS, but the patient does not want to stay for this.  I feel that this is reasonable given the duration of her symptoms and her overall low risk for ACS.  I counseled her on these risks and she expressed understanding.  She demonstrates appropriate decision-making capacity.  She is stable for discharge at this time.  I will prescribe Carafate and have encouraged her to make sure she is taking her Prilosec and avoiding foods that can exacerbate GERD.  Return precautions given, and she expressed understanding. ____________________________________________   FINAL CLINICAL IMPRESSION(S) / ED DIAGNOSES  Final diagnoses:  Atypical chest pain      NEW MEDICATIONS STARTED DURING THIS VISIT:  New Prescriptions   SUCRALFATE (CARAFATE) 1 GM/10ML SUSPENSION    Take 10 mLs (1 g total) by mouth 4 (four) times daily for 7 days.     Note:  This document was prepared using  Dragon voice recognition software and may include unintentional dictation errors.    Arta Silence, MD 06/17/18 1353

## 2018-06-17 NOTE — Discharge Instructions (Addendum)
Follow-up with your regular primary care and GI doctors.  Return to the ER for new, worsening, persistent pain, nausea or vomiting, fever, or any other new or worsening symptoms that concern you.

## 2018-06-22 ENCOUNTER — Other Ambulatory Visit: Payer: Self-pay | Admitting: Family Medicine

## 2018-06-22 DIAGNOSIS — M5412 Radiculopathy, cervical region: Secondary | ICD-10-CM | POA: Diagnosis not present

## 2018-06-23 ENCOUNTER — Ambulatory Visit (INDEPENDENT_AMBULATORY_CARE_PROVIDER_SITE_OTHER): Payer: BLUE CROSS/BLUE SHIELD

## 2018-06-23 DIAGNOSIS — Z23 Encounter for immunization: Secondary | ICD-10-CM

## 2018-06-23 NOTE — Telephone Encounter (Signed)
Called and spoke with pt. Pt stated that she will be out of medication on Monday. Pt is OK with waiting until PCP returns.   Last OV 11/08/2017   Last refilled 05/18/2018 disp 30 with 1 refill   Sent to PCP

## 2018-06-25 NOTE — Telephone Encounter (Signed)
She needs to have an appointment scheduled for follow-up prior to a refill being sent in. She has not followed up since March.

## 2018-06-28 ENCOUNTER — Other Ambulatory Visit: Payer: Self-pay | Admitting: Family Medicine

## 2018-06-28 NOTE — Telephone Encounter (Signed)
You can schedule her for a 430 appointment on November 8.  She will have to be seen in the office prior to refilling this medication.  Thanks.

## 2018-06-28 NOTE — Telephone Encounter (Signed)
Okay to refill? Pt no showed & cancelled last two appt.

## 2018-06-28 NOTE — Telephone Encounter (Signed)
Called pt and tried to get her scheduled for an OV last seen by PCP was in March 2019 last seen by Derrel Nip in September.   Schedule is pretty full until January. Pt is completely out of medication could we fix her in some time in November for medcheck?  Pt is off work on Thursdays and Fridays.

## 2018-06-28 NOTE — Telephone Encounter (Signed)
Pt called to check status of this medication-states she is completely out.

## 2018-06-29 NOTE — Telephone Encounter (Signed)
Pt called back and she does want to schedule OV.

## 2018-06-30 ENCOUNTER — Encounter: Payer: Self-pay | Admitting: Family Medicine

## 2018-06-30 ENCOUNTER — Ambulatory Visit: Payer: BLUE CROSS/BLUE SHIELD | Admitting: Family Medicine

## 2018-06-30 VITALS — BP 98/70 | HR 83 | Temp 98.3°F | Ht 66.0 in | Wt 130.2 lb

## 2018-06-30 DIAGNOSIS — M25511 Pain in right shoulder: Secondary | ICD-10-CM | POA: Diagnosis not present

## 2018-06-30 DIAGNOSIS — M79671 Pain in right foot: Secondary | ICD-10-CM | POA: Diagnosis not present

## 2018-06-30 DIAGNOSIS — K219 Gastro-esophageal reflux disease without esophagitis: Secondary | ICD-10-CM

## 2018-06-30 DIAGNOSIS — M542 Cervicalgia: Secondary | ICD-10-CM | POA: Diagnosis not present

## 2018-06-30 DIAGNOSIS — F909 Attention-deficit hyperactivity disorder, unspecified type: Secondary | ICD-10-CM | POA: Diagnosis not present

## 2018-06-30 DIAGNOSIS — R7303 Prediabetes: Secondary | ICD-10-CM | POA: Diagnosis not present

## 2018-06-30 DIAGNOSIS — M25512 Pain in left shoulder: Secondary | ICD-10-CM | POA: Diagnosis not present

## 2018-06-30 MED ORDER — AMPHETAMINE-DEXTROAMPHETAMINE 12.5 MG PO TABS: 12.5000 mg | ORAL_TABLET | Freq: Two times a day (BID) | ORAL | 0 refills | Status: DC

## 2018-06-30 MED ORDER — PANTOPRAZOLE SODIUM 40 MG PO TBEC: 40.0000 mg | DELAYED_RELEASE_TABLET | Freq: Every day | ORAL | 1 refills | Status: DC

## 2018-06-30 NOTE — Assessment & Plan Note (Signed)
Patient has had issues sleeping with the extended release.  We will switch her to a lower dose immediate release.  Controlled substance database reviewed.  Follow-up in 1 month.  I did discuss the importance of routine follow-up every 3 to 4 months for this issue.

## 2018-06-30 NOTE — Assessment & Plan Note (Signed)
Discussed dietary changes.

## 2018-06-30 NOTE — Assessment & Plan Note (Signed)
I suspect the patient's epigastric discomfort is likely related to gastritis.  She had negative lab work in the emergency department.  We will have her change to Protonix.  She will discontinue Prilosec.  We will arrange for follow-up with her GI physician.

## 2018-06-30 NOTE — Patient Instructions (Signed)
Nice to see you. We will change you to Protonix.  Please discontinue the Prilosec.  We will get you to see GI. We will refer you to podiatry. We are changing your extended release Adderall to immediate release.  This will be at a slightly lower daily dose.  If you continue to have sleep issues with this please let us know.

## 2018-06-30 NOTE — Assessment & Plan Note (Signed)
Refer to podiatry

## 2018-06-30 NOTE — Progress Notes (Signed)
Tommi Rumps, MD Phone: [phone]  Suzanne Cruz is a 52 y.o. female who presents today for f/u.  CC: adhd, right foot pain, GERD, prediabetes  ADHD: Taking Adderall XR 30 mg daily.  Notes she does have issues sleeping at night when she takes this.  The day she does not take it she has no issues sleeping.  No palpitations.  No appetite suppression.  Right foot pain: Patient notes callus on the bottom of her right foot.  She notes there has been a chunk of the callus taken out.  Not healing all that well.  No drainage.  There is a burning sensation in that area.  She wants to see a podiatrist.  Prediabetes: Does crave sugar at times.  No polyuria or polydipsia.  GERD: Patient recently seen in the emergency department due to epigastric discomfort radiating into her chest.  She had a negative troponin.  It was felt to be GI related.  She notes that has improved to some degree though she does still occasionally have symptoms.  She is taking Prilosec and Carafate.  She reports prior EGD and colonoscopy in July.  She is status post hysterectomy.  She does still have her gallbladder and appendix.  She follows with urology for occasional dysuria.  She notes no blood in her stool.  She has had dysphagia in the past.  Social History   Tobacco Use  Smoking Status Never Smoker  Smokeless Tobacco Never Used     ROS see history of present illness  Objective  Physical Exam Vitals:   06/30/18 1615  BP: 98/70  Pulse: 83  Temp: 98.3 F (36.8 C)  SpO2: 97%    BP Readings from Last 3 Encounters:  06/30/18 98/70  06/17/18 (!) 108/45  05/26/18 119/63   Wt Readings from Last 3 Encounters:  06/30/18 130 lb 3.2 oz (59.1 kg)  06/17/18 126 lb (57.2 kg)  05/26/18 126 lb (57.2 kg)    Physical Exam  Constitutional: No distress.  Cardiovascular: Normal rate, regular rhythm and normal heart sounds.  Pulmonary/Chest: Effort normal and breath sounds normal.  Abdominal: Soft. Bowel sounds  are normal. She exhibits no distension. There is tenderness (Mild epigastric tenderness). There is no rebound and no guarding.  Musculoskeletal: She exhibits no edema.  Neurological: She is alert.  Skin: Skin is warm and dry. She is not diaphoretic.  Plantar surface of her first MTP joint with callus noted, part of the callus appears to have been removed though there are no signs of infection   Assessment/Plan: Please see individual problem list.  GERD (gastroesophageal reflux disease) I suspect the patient's epigastric discomfort is likely related to gastritis.  She had negative lab work in the emergency department.  We will have her change to Protonix.  She will discontinue Prilosec.  We will arrange for follow-up with her GI physician.  Adult ADHD Patient has had issues sleeping with the extended release.  We will switch her to a lower dose immediate release.  Controlled substance database reviewed.  Follow-up in 1 month.  I did discuss the importance of routine follow-up every 3 to 4 months for this issue.  Foot pain, right Refer to podiatry.  Prediabetes Discussed dietary changes.   Orders Placed This Encounter  Procedures  . Ambulatory referral to Gastroenterology    Referral Priority:   Routine    Referral Type:   Consultation    Referral Reason:   Specialty Services Required    Number of Visits Requested:  1  . Ambulatory referral to Podiatry    Referral Priority:   Routine    Referral Type:   Consultation    Referral Reason:   Specialty Services Required    Requested Specialty:   Podiatry    Number of Visits Requested:   1    Meds ordered this encounter  Medications  . pantoprazole (PROTONIX) 40 MG tablet    Sig: Take 1 tablet (40 mg total) by mouth daily.    Dispense:  30 tablet    Refill:  1  . amphetamine-dextroamphetamine (ADDERALL) 12.5 MG tablet    Sig: Take 1 tablet by mouth 2 (two) times daily.    Dispense:  60 tablet    Refill:  0     Tommi Rumps, MD [place]

## 2018-07-10 ENCOUNTER — Ambulatory Visit: Payer: BLUE CROSS/BLUE SHIELD | Admitting: Family Medicine

## 2018-07-12 DIAGNOSIS — H18453 Nodular corneal degeneration, bilateral: Secondary | ICD-10-CM | POA: Diagnosis not present

## 2018-07-12 DIAGNOSIS — H04123 Dry eye syndrome of bilateral lacrimal glands: Secondary | ICD-10-CM | POA: Diagnosis not present

## 2018-07-12 DIAGNOSIS — H16422 Pannus (corneal), left eye: Secondary | ICD-10-CM | POA: Diagnosis not present

## 2018-07-20 ENCOUNTER — Other Ambulatory Visit: Payer: Self-pay | Admitting: Gastroenterology

## 2018-07-20 DIAGNOSIS — R1314 Dysphagia, pharyngoesophageal phase: Secondary | ICD-10-CM | POA: Diagnosis not present

## 2018-07-20 DIAGNOSIS — R112 Nausea with vomiting, unspecified: Secondary | ICD-10-CM | POA: Diagnosis not present

## 2018-07-20 DIAGNOSIS — R198 Other specified symptoms and signs involving the digestive system and abdomen: Secondary | ICD-10-CM | POA: Diagnosis not present

## 2018-07-20 DIAGNOSIS — R1013 Epigastric pain: Secondary | ICD-10-CM | POA: Diagnosis not present

## 2018-07-21 ENCOUNTER — Other Ambulatory Visit: Payer: Self-pay | Admitting: Podiatry

## 2018-07-21 ENCOUNTER — Encounter: Payer: Self-pay | Admitting: Podiatry

## 2018-07-21 ENCOUNTER — Ambulatory Visit (INDEPENDENT_AMBULATORY_CARE_PROVIDER_SITE_OTHER): Payer: BLUE CROSS/BLUE SHIELD

## 2018-07-21 ENCOUNTER — Ambulatory Visit: Payer: BLUE CROSS/BLUE SHIELD | Admitting: Podiatry

## 2018-07-21 VITALS — BP 119/73 | HR 91

## 2018-07-21 DIAGNOSIS — M21612 Bunion of left foot: Secondary | ICD-10-CM

## 2018-07-21 DIAGNOSIS — M722 Plantar fascial fibromatosis: Secondary | ICD-10-CM | POA: Diagnosis not present

## 2018-07-21 DIAGNOSIS — M21611 Bunion of right foot: Secondary | ICD-10-CM | POA: Diagnosis not present

## 2018-07-23 ENCOUNTER — Other Ambulatory Visit: Payer: Self-pay | Admitting: Family Medicine

## 2018-07-23 DIAGNOSIS — K219 Gastro-esophageal reflux disease without esophagitis: Secondary | ICD-10-CM

## 2018-07-23 NOTE — Progress Notes (Signed)
HPI: 52 year old female presents the office today as a new patient for evaluation regarding bunions to the bilateral feet.  She does have a history of a bunionectomy to her left foot back in 2006 in [place].  Patient states that the bunion is come back and return.  She now complains of the right foot is getting painful as well.  Conservative modalities been unsuccessful including shoe gear modifications.  She continues to have bilateral foot pain associated to the bunion deformities.  She presents for further treatment evaluation  Past Medical History:  Diagnosis Date  . Chicken pox   . Colon polyps 2015  . Cyst of left kidney   . Depression   . Dysphagia   . Endometriosis   . GERD (gastroesophageal reflux disease)   . History of gastritis 09/17/2015  . History of kidney stones   . History of stomach ulcers   . IBS (irritable bowel syndrome)   . Migraines      Physical Exam: General: The patient is alert and oriented x3 in no acute distress.  Dermatology: Skin is warm, dry and supple bilateral lower extremities. Negative for open lesions or macerations.  Vascular: Palpable pedal pulses bilaterally. No edema or erythema noted. Capillary refill within normal limits.  Neurological: Epicritic and protective threshold grossly intact bilaterally.   Musculoskeletal Exam: Range of motion within normal limits to all pedal and ankle joints bilateral. Muscle strength 5/5 in all groups bilateral.  Large medial eminence of the first metatarsal heads noted to the bilateral feet.  Radiographic Exam:  Normal osseous mineralization. Joint spaces preserved. No fracture/dislocation/boney destruction.  In regards to the history of the bunionectomy to the left foot there is no orthopedic hardware noted.  There continues to be a significant increased intermetatarsal angle noted to the first intermetatarsal space greater than 15 degrees with abnormal alignment of the proximal articular set angle of the  metatarsal head.  There is also some abnormal appearance of the diaphysis of the proximal phalanx to the left hallux possibly consistent with [facility] osteotomy without any internal fixation.  Assessment: 1.  Hallux abductovalgus deformity bilateral 2.  History of bunionectomy left-2006   Plan of Care:  1. Patient evaluated. X-Rays reviewed.  2.  Left foot bunion: Today explained to the patient that revisional surgery to the bunionectomy site of the left foot could be achieved.  We discussed different surgical options including arthrodesis vs. arthroplasty with implant vs. revisional bunionectomy.  I feel like it would be that in the best interest of the patient and provide good surgical outcome to have a revisional bunionectomy surgery performed.  Surgery would consist of Reverdin-laird modified procedure to correct for the IM angle, correct the proximal articular set angle (PASA), and restore length and load sharing to the 1st metatarsal. Likely fixated with internal k-wire fixation.  3.  Right foot bunion: Bunionectomy procedure to the right foot would consist of bunionectomy with first metatarsal osteotomy and possible Akin osteotomy.  I explained to the patient that the right foot bunionectomy procedure should have much greater success and more reproducible results given the fact that it is not a revisional surgery.  Patient understands. 4.  Both surgeries would allow the patient to be weightbearing.  Patient would like to pursue surgical intervention however she would like to pursue it in the spring 2020.  Recommend the patient returns spring 2020 for surgical consultation and to answer any additional questions.       Edrick Kins, DPM [facility]  Dr. Edrick Kins, DPM    [address], [place] [address]                Office [phone]  Fax [phone]

## 2018-07-28 ENCOUNTER — Ambulatory Visit: Admission: RE | Admit: 2018-07-28 | Payer: BLUE CROSS/BLUE SHIELD | Source: Ambulatory Visit

## 2018-08-10 DIAGNOSIS — F4320 Adjustment disorder, unspecified: Secondary | ICD-10-CM | POA: Diagnosis not present

## 2018-08-17 DIAGNOSIS — F4320 Adjustment disorder, unspecified: Secondary | ICD-10-CM | POA: Diagnosis not present

## 2018-08-18 ENCOUNTER — Ambulatory Visit: Payer: BLUE CROSS/BLUE SHIELD | Admitting: Family Medicine

## 2018-08-18 ENCOUNTER — Encounter: Payer: Self-pay | Admitting: Family Medicine

## 2018-08-18 DIAGNOSIS — F909 Attention-deficit hyperactivity disorder, unspecified type: Secondary | ICD-10-CM

## 2018-08-18 DIAGNOSIS — G8929 Other chronic pain: Secondary | ICD-10-CM

## 2018-08-18 DIAGNOSIS — K219 Gastro-esophageal reflux disease without esophagitis: Secondary | ICD-10-CM

## 2018-08-18 DIAGNOSIS — H9202 Otalgia, left ear: Secondary | ICD-10-CM | POA: Diagnosis not present

## 2018-08-18 DIAGNOSIS — M542 Cervicalgia: Secondary | ICD-10-CM | POA: Diagnosis not present

## 2018-08-18 NOTE — Assessment & Plan Note (Signed)
The patient has only been taking her Adderall immediate release once daily.  I discussed that this will only last for 4 to 6 hours.  She needs to take the second dose to see if it is beneficial in the afternoon and evening and to determine whether or not this keeps her awake at night.  She asked to increase the dose though I advised this would not make the medication last longer into the day.  She will trial the Adderall immediate release 12.5 mg twice daily.  She will contact us to let us know if this is beneficial.  If it is beneficial she will continue on her current medication.  If it is not beneficial we will switch her to Adderall extended release 20 mg daily to see if that is beneficial.

## 2018-08-18 NOTE — Assessment & Plan Note (Signed)
Patient has been evaluated by GI for her epigastric discomfort.  Dicyclomine is somewhat beneficial.  I have encouraged the patient to proceed with a barium swallow as a previously discussed.  She will follow-up with GI as planned.

## 2018-08-18 NOTE — Assessment & Plan Note (Signed)
This could represent eustachian tube dysfunction versus TMJ.  Discussed trial of Claritin or Flonase.  If this is not beneficial she should see her dentist to evaluate for TMJ.

## 2018-08-18 NOTE — Progress Notes (Signed)
Tommi Rumps, MD Phone: [phone]  Suzanne Cruz is a 52 y.o. female who presents today for f/u.  CC: ADHD, epigastric pain, neck pain, left ear pain  ADHD: Taking Adderall 12.5 mg immediate release.  She is only taking this about once a day.  She is sleeping better than she was previously though she notes that the Adderall does not last past noon.  She notes some mild dry mouth with this.  No palpitations.  Epigastric pain: Patient has undergone evaluation through GI.  She had negative lab work-up.  They recommended barium swallow which the patient still needs to have done.  She does note dicyclomine does help some.  She notes intermittent epigastric discomfort.  Several weeks ago she had 2 episodes of vomiting though otherwise has had no vomiting.  No blood in her stool.  She stopped the Protonix.  Her symptoms are not specifically brought on by food.  She does note some potato chips getting hung up in her esophagus though she was able to get them down.  Chronic neck pain: Patient has been dealing with this for some time now.  She does follow with orthopedics.  They had her do physical therapy and she reports they discussed doing injections though she is unsure about those.  In the past she has been taking diclofenac for her pain with good benefit though she does have history of gastritis.  Tylenol somewhat helpful.  Flexeril is helpful though it makes her drowsy.  She notes no radiation down her arms or legs.  She does note headaches related to her neck pain.  No numbness or weakness.  Left ear pain: Patient notes she is getting over an upper respiratory infection.  She notes over the last several days her left ear has been bothering her.  She wonders if it may be TMJ related.  She notes no tinnitus.  No fevers.  She still has some mild congestion.  Social History   Tobacco Use  Smoking Status Never Smoker  Smokeless Tobacco Never Used     ROS see history of present  illness  Objective  Physical Exam Vitals:   08/18/18 1325  BP: 104/70  Pulse: (!) 102  Temp: 98.3 F (36.8 C)  SpO2: 98%    BP Readings from Last 3 Encounters:  08/18/18 104/70  07/21/18 119/73  06/30/18 98/70   Wt Readings from Last 3 Encounters:  08/18/18 131 lb (59.4 kg)  06/30/18 130 lb 3.2 oz (59.1 kg)  06/17/18 126 lb (57.2 kg)    Physical Exam Constitutional:      General: She is not in acute distress.    Appearance: She is not diaphoretic.  HENT:     Right Ear: Tympanic membrane, ear canal and external ear normal.     Left Ear: Tympanic membrane, ear canal and external ear normal.  Cardiovascular:     Rate and Rhythm: Normal rate and regular rhythm.     Heart sounds: Normal heart sounds.  Pulmonary:     Effort: Pulmonary effort is normal.     Breath sounds: Normal breath sounds.  Musculoskeletal:     Comments: No midline neck tenderness, no midline neck step-off, there is muscular neck tenderness  Skin:    General: Skin is warm and dry.  Neurological:     Mental Status: She is alert.     Comments: 5/5 strength bilateral biceps, triceps, grip, quads, hamstrings, plantar flexion, and dorsiflexion, sensation light touch intact bilateral upper and lower extremities, 2+ patellar  reflexes   She does note tenderness over her left TMJ   Assessment/Plan: Please see individual problem list.  GERD (gastroesophageal reflux disease) Patient has been evaluated by GI for her epigastric discomfort.  Dicyclomine is somewhat beneficial.  I have encouraged the patient to proceed with a barium swallow as a previously discussed.  She will follow-up with GI as planned.  Adult ADHD The patient has only been taking her Adderall immediate release once daily.  I discussed that this will only last for 4 to 6 hours.  She needs to take the second dose to see if it is beneficial in the afternoon and evening and to determine whether or not this keeps her awake at night.  She asked to  increase the dose though I advised this would not make the medication last longer into the day.  She will trial the Adderall immediate release 12.5 mg twice daily.  She will contact us to let us know if this is beneficial.  If it is beneficial she will continue on her current medication.  If it is not beneficial we will switch her to Adderall extended release 20 mg daily to see if that is beneficial.  Left ear pain This could represent eustachian tube dysfunction versus TMJ.  Discussed trial of Claritin or Flonase.  If this is not beneficial she should see her dentist to evaluate for TMJ.  Chronic neck pain Chronic issue.  Given her gastritis on recent EGD NSAIDs are not a great option.  I discussed this with her.  Discussed as needed Flexeril and Tylenol use.  Discussed having her see her orthopedist again and she will arrange for follow-up.  She may need to consider injections versus continued physical therapy.    No orders of the defined types were placed in this encounter.   No orders of the defined types were placed in this encounter.    Tommi Rumps, MD [place]

## 2018-08-18 NOTE — Assessment & Plan Note (Signed)
Chronic issue.  Given her gastritis on recent EGD NSAIDs are not a great option.  I discussed this with her.  Discussed as needed Flexeril and Tylenol use.  Discussed having her see her orthopedist again and she will arrange for follow-up.  She may need to consider injections versus continued physical therapy.

## 2018-08-18 NOTE — Patient Instructions (Signed)
Nice to see you. Please try the Adderall immediate release 12.5 mg twice daily.  If this affects your sleep please let us know and we can switch to extended release Adderall at a lower dose. Please complete the evaluation through GI with barium swallow test. Please contact your orthopedist for follow-up on your neck. Please try Flonase or Claritin for your left ear discomfort.  If that is not beneficial you could see your dentist for treatment of TMJ.

## 2018-08-25 ENCOUNTER — Other Ambulatory Visit: Payer: Self-pay | Admitting: Family Medicine

## 2018-08-25 ENCOUNTER — Encounter: Payer: Self-pay | Admitting: Family Medicine

## 2018-08-25 DIAGNOSIS — F909 Attention-deficit hyperactivity disorder, unspecified type: Secondary | ICD-10-CM

## 2018-08-25 MED ORDER — AMPHETAMINE-DEXTROAMPHETAMINE 12.5 MG PO TABS: 12.5000 mg | ORAL_TABLET | Freq: Two times a day (BID) | ORAL | 0 refills | Status: DC

## 2018-08-25 NOTE — Telephone Encounter (Signed)
Sent to pharmacy.  Controlled substance database reviewed. 

## 2018-08-29 NOTE — Telephone Encounter (Signed)
I tried to call patient, but VM box was full.

## 2018-08-29 NOTE — Telephone Encounter (Signed)
I just sent this to her pharmacy and it was just refilled based on review of the controlled substance database.  Please check with the patient to see if she requested this again or if the pharmacy sent this in.

## 2018-08-31 ENCOUNTER — Telehealth: Payer: Self-pay | Admitting: *Deleted

## 2018-08-31 NOTE — Telephone Encounter (Signed)
Called and spoke with patient. Unable to figure out why patient was called she stated that she did pick up her Rx that was refilled on December 27 and she thought maybe the call was due to her medication. Issue resolved.

## 2018-08-31 NOTE — Telephone Encounter (Signed)
Copied from [place] [phone]. Topic: General - Other >> Aug 29, 2018 12:05 PM Leward Quan A wrote: Reason for CRM: Patient called to say that she had a missed call but nothing noted in chart. Pt Ph# [phone]

## 2018-09-04 DIAGNOSIS — R300 Dysuria: Secondary | ICD-10-CM | POA: Diagnosis not present

## 2018-09-07 DIAGNOSIS — F4320 Adjustment disorder, unspecified: Secondary | ICD-10-CM | POA: Diagnosis not present

## 2018-09-08 DIAGNOSIS — R102 Pelvic and perineal pain: Secondary | ICD-10-CM | POA: Diagnosis not present

## 2018-09-15 ENCOUNTER — Other Ambulatory Visit: Payer: Self-pay

## 2018-09-15 ENCOUNTER — Emergency Department
Admission: EM | Admit: 2018-09-15 | Discharge: 2018-09-15 | Disposition: A | Discharge status: Home / Self Care | Payer: BLUE CROSS/BLUE SHIELD | Attending: Emergency Medicine | Admitting: Emergency Medicine

## 2018-09-15 ENCOUNTER — Emergency Department: Payer: BLUE CROSS/BLUE SHIELD

## 2018-09-15 DIAGNOSIS — S3991XA Unspecified injury of abdomen, initial encounter: Secondary | ICD-10-CM | POA: Diagnosis not present

## 2018-09-15 DIAGNOSIS — M25511 Pain in right shoulder: Secondary | ICD-10-CM | POA: Diagnosis not present

## 2018-09-15 DIAGNOSIS — Z79899 Other long term (current) drug therapy: Secondary | ICD-10-CM | POA: Insufficient documentation

## 2018-09-15 DIAGNOSIS — S299XXA Unspecified injury of thorax, initial encounter: Secondary | ICD-10-CM | POA: Diagnosis not present

## 2018-09-15 DIAGNOSIS — R1031 Right lower quadrant pain: Secondary | ICD-10-CM | POA: Insufficient documentation

## 2018-09-15 DIAGNOSIS — S161XXA Strain of muscle, fascia and tendon at neck level, initial encounter: Secondary | ICD-10-CM | POA: Diagnosis not present

## 2018-09-15 DIAGNOSIS — R109 Unspecified abdominal pain: Secondary | ICD-10-CM | POA: Diagnosis not present

## 2018-09-15 DIAGNOSIS — G44319 Acute post-traumatic headache, not intractable: Secondary | ICD-10-CM | POA: Insufficient documentation

## 2018-09-15 DIAGNOSIS — Y999 Unspecified external cause status: Secondary | ICD-10-CM | POA: Diagnosis not present

## 2018-09-15 DIAGNOSIS — M542 Cervicalgia: Secondary | ICD-10-CM | POA: Diagnosis not present

## 2018-09-15 DIAGNOSIS — Y92410 Unspecified street and highway as the place of occurrence of the external cause: Secondary | ICD-10-CM | POA: Insufficient documentation

## 2018-09-15 DIAGNOSIS — Y9389 Activity, other specified: Secondary | ICD-10-CM | POA: Insufficient documentation

## 2018-09-15 DIAGNOSIS — R51 Headache: Secondary | ICD-10-CM | POA: Diagnosis not present

## 2018-09-15 DIAGNOSIS — S1982XA Other specified injuries of cervical trachea, initial encounter: Secondary | ICD-10-CM | POA: Diagnosis not present

## 2018-09-15 DIAGNOSIS — S0990XA Unspecified injury of head, initial encounter: Secondary | ICD-10-CM | POA: Diagnosis not present

## 2018-09-15 DIAGNOSIS — S199XXA Unspecified injury of neck, initial encounter: Secondary | ICD-10-CM | POA: Diagnosis not present

## 2018-09-15 DIAGNOSIS — R079 Chest pain, unspecified: Secondary | ICD-10-CM | POA: Diagnosis not present

## 2018-09-15 DIAGNOSIS — S4991XA Unspecified injury of right shoulder and upper arm, initial encounter: Secondary | ICD-10-CM | POA: Diagnosis not present

## 2018-09-15 LAB — URINALYSIS, COMPLETE (UACMP) WITH MICROSCOPIC
Bacteria, UA: NONE SEEN
Bilirubin Urine: NEGATIVE
GLUCOSE, UA: NEGATIVE mg/dL
Hgb urine dipstick: NEGATIVE
Ketones, ur: NEGATIVE mg/dL
LEUKOCYTES UA: NEGATIVE
Nitrite: NEGATIVE
PH: 6 (ref 5.0–8.0)
Protein, ur: NEGATIVE mg/dL
SPECIFIC GRAVITY, URINE: 1.021 (ref 1.005–1.030)

## 2018-09-15 LAB — COMPREHENSIVE METABOLIC PANEL
ALT: 13 U/L (ref 0–44)
AST: 18 U/L (ref 15–41)
Albumin: 4.3 g/dL (ref 3.5–5.0)
Alkaline Phosphatase: 67 U/L (ref 38–126)
Anion gap: 6 (ref 5–15)
BUN: 20 mg/dL (ref 6–20)
CHLORIDE: 107 mmol/L (ref 98–111)
CO2: 29 mmol/L (ref 22–32)
CREATININE: 0.86 mg/dL (ref 0.44–1.00)
Calcium: 9.2 mg/dL (ref 8.9–10.3)
GFR calc non Af Amer: >60 mL/min (ref >60)
Glucose, Bld: 91 mg/dL (ref 70–99)
POTASSIUM: 3.4 mmol/L — AB (ref 3.5–5.1)
SODIUM: 142 mmol/L (ref 135–145)
Total Bilirubin: 0.6 mg/dL (ref 0.3–1.2)
Total Protein: 7.6 g/dL (ref 6.5–8.1)

## 2018-09-15 LAB — CBC WITH DIFFERENTIAL/PLATELET
ABS IMMATURE GRANULOCYTES: 0.01 10*3/uL (ref 0.00–0.07)
Basophils Absolute: 0 10*3/uL (ref 0.0–0.1)
Basophils Relative: 0 %
Eosinophils Absolute: 0.1 10*3/uL (ref 0.0–0.5)
Eosinophils Relative: 2 %
HCT: 41.2 % (ref 36.0–46.0)
Hemoglobin: 13.2 g/dL (ref 12.0–15.0)
IMMATURE GRANULOCYTES: 0 %
Lymphocytes Relative: 29 %
Lymphs Abs: 1.8 10*3/uL (ref 0.7–4.0)
MCH: 28.6 pg (ref 26.0–34.0)
MCHC: 32 g/dL (ref 30.0–36.0)
MCV: 89.2 fL (ref 80.0–100.0)
MONO ABS: 0.4 10*3/uL (ref 0.1–1.0)
Monocytes Relative: 7 %
NEUTROS ABS: 3.7 10*3/uL (ref 1.7–7.7)
NEUTROS PCT: 62 %
PLATELETS: 237 10*3/uL (ref 150–400)
RBC: 4.62 MIL/uL (ref 3.87–5.11)
RDW: 12.6 % (ref 11.5–15.5)
WBC: 6 10*3/uL (ref 4.0–10.5)
nRBC: 0 % (ref 0.0–0.2)

## 2018-09-15 MED ORDER — HYDROCODONE-ACETAMINOPHEN 5-325 MG PO TABS
1.0000 | ORAL_TABLET | Freq: Once | ORAL | Status: AC
  Administered 2018-09-15: 1 via ORAL
  Filled 2018-09-15: qty 1

## 2018-09-15 MED ORDER — IOHEXOL 300 MG/ML  SOLN
100.0000 mL | Freq: Once | INTRAMUSCULAR | Status: AC | PRN
  Administered 2018-09-15: 100 mL via INTRAVENOUS
  Filled 2018-09-15: qty 100

## 2018-09-15 MED ORDER — HYDROCODONE-ACETAMINOPHEN 5-325 MG PO TABS: 1.0000 | ORAL_TABLET | Freq: Four times a day (QID) | ORAL | 0 refills | Status: DC | PRN
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
## 2018-09-15 NOTE — Discharge Instructions (Signed)
Follow-up with your primary care provider if any continued problems.  Continue taking your Flexeril that you have at home if needed for muscle spasms.  Norco is a narcotic and should not be taken while driving or operating machinery.  This medication could cause drowsiness and increase your risk for falling.  Also ice or heat to your muscles as needed for discomfort.  The normal even with medication is being sore for approximately 4 to 5 days.  Return to the emergency department over the weekend if any severe worsening of your symptoms or urgent concerns.
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
## 2018-09-27 ENCOUNTER — Ambulatory Visit: Payer: BLUE CROSS/BLUE SHIELD | Admitting: Family Medicine

## 2018-09-27 ENCOUNTER — Ambulatory Visit (INDEPENDENT_AMBULATORY_CARE_PROVIDER_SITE_OTHER): Payer: Self-pay | Admitting: Family

## 2018-09-27 ENCOUNTER — Ambulatory Visit (INDEPENDENT_AMBULATORY_CARE_PROVIDER_SITE_OTHER): Payer: BLUE CROSS/BLUE SHIELD

## 2018-09-27 ENCOUNTER — Encounter: Payer: Self-pay | Admitting: Family

## 2018-09-27 VITALS — BP 112/76 | HR 103 | Temp 97.9°F | Wt 129.8 lb

## 2018-09-27 DIAGNOSIS — R109 Unspecified abdominal pain: Secondary | ICD-10-CM

## 2018-09-27 DIAGNOSIS — S2241XA Multiple fractures of ribs, right side, initial encounter for closed fracture: Secondary | ICD-10-CM | POA: Diagnosis not present

## 2018-09-27 MED ORDER — DICLOFENAC SODIUM 1 % TD GEL: 4.0000 g | Freq: Four times a day (QID) | TRANSDERMAL | 3 refills | Status: DC

## 2018-09-27 MED ORDER — LIDOCAINE 5 % EX PTCH: 1.0000 | MEDICATED_PATCH | CUTANEOUS | 0 refills | Status: DC

## 2018-09-27 MED ORDER — CYCLOBENZAPRINE HCL 5 MG PO TABS: 5.0000 mg | ORAL_TABLET | Freq: Every evening | ORAL | 0 refills | Status: AC | PRN

## 2018-09-27 NOTE — Patient Instructions (Signed)
May stop norco  Flexeril at bedtime. Do not drive or operate heavy machinery while on muscle relaxant. Please do not drink alcohol. Only take this medication as needed for acute muscle spasm at bedtime. This medication make you feel drowsy so be very careful.  Stop taking if become too drowsy or somnolent as this puts you at risk for falls. Please contact our office with any questions.   HEAT HEAT HEAT  voltaren gel, lidocaine patch  Let me know if not better with continued conservative management
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
## 2018-10-03 ENCOUNTER — Encounter: Payer: Self-pay | Admitting: Family

## 2018-10-12 ENCOUNTER — Telehealth: Payer: Self-pay

## 2018-10-12 NOTE — Telephone Encounter (Signed)
PA done for LIDOCAINE PATCHES was rejected or denied   This request has received a Unfavorable outcome. Please see letter faxed to your office for details on this adverse benefit determination. Please note any additional information provided by Sherre Poot [place] at the bottom of this request.  Sent to [facility] was prescribed by you

## 2018-10-12 NOTE — Telephone Encounter (Signed)
Noted.  We could certainly repeat an x-ray at some point though it is not necessary and I believe it would likely be too soon for this to be completely healed at this point.  We could have her return to the office to see me or Joycelyn Schmid to determine if repeat x-ray is necessary.

## 2018-10-12 NOTE — Telephone Encounter (Signed)
Lisaann [facility]OUT [facility] - Rx #: [id number]    PA has been done for diclofenac gel waiting on determination   Rx was prescribed by [facility]

## 2018-10-12 NOTE — Telephone Encounter (Signed)
I have scheduled patient f/u for re-evaluation to see if x-ray is needed.

## 2018-10-13 NOTE — Telephone Encounter (Signed)
Advise pt to use OTC salon pas

## 2018-10-13 NOTE — Telephone Encounter (Signed)
Patient advised.

## 2018-10-17 NOTE — Telephone Encounter (Signed)
Unfavorable outcome for DICLOFENAC sodium 1% gel   denied on 10/16/2018   Sent to [place] - Rx #: [id number]

## 2018-10-18 NOTE — Telephone Encounter (Signed)
Patient advised of this 10/13/18.

## 2018-10-18 NOTE — Telephone Encounter (Signed)
Noted  Advise she may use over-the-counter topical medications.  UNFortunately  insurance is not going to approve the prescriptions   please advise patient that salon poss, Aspercreme, lidocaine gel, heat

## 2018-10-20 ENCOUNTER — Ambulatory Visit (INDEPENDENT_AMBULATORY_CARE_PROVIDER_SITE_OTHER): Payer: BLUE CROSS/BLUE SHIELD

## 2018-10-20 ENCOUNTER — Encounter: Payer: Self-pay | Admitting: Family

## 2018-10-20 ENCOUNTER — Ambulatory Visit (INDEPENDENT_AMBULATORY_CARE_PROVIDER_SITE_OTHER): Payer: Self-pay | Admitting: Family

## 2018-10-20 VITALS — BP 110/68 | HR 92 | Temp 98.3°F | Wt 124.4 lb

## 2018-10-20 DIAGNOSIS — R109 Unspecified abdominal pain: Secondary | ICD-10-CM

## 2018-10-20 DIAGNOSIS — S2231XD Fracture of one rib, right side, subsequent encounter for fracture with routine healing: Secondary | ICD-10-CM | POA: Diagnosis not present

## 2018-10-20 DIAGNOSIS — F909 Attention-deficit hyperactivity disorder, unspecified type: Secondary | ICD-10-CM

## 2018-10-20 DIAGNOSIS — Z3202 Encounter for pregnancy test, result negative: Secondary | ICD-10-CM

## 2018-10-20 MED ORDER — AMPHETAMINE-DEXTROAMPHETAMINE 12.5 MG PO TABS: 12.5000 mg | ORAL_TABLET | Freq: Two times a day (BID) | ORAL | 0 refills | Status: DC

## 2018-10-20 NOTE — Patient Instructions (Signed)
Nice to see you  Let me know if anything at all!

## 2018-10-20 NOTE — Assessment & Plan Note (Signed)
Pleased as clinically improved.  Pending x-ray to follow.

## 2018-10-20 NOTE — Assessment & Plan Note (Signed)
Doing well on current regimen, will continue . has had regular follow with her PCP, most recently December 2019.  Last refill per epic 06/2018.  Unable to access patient's record for some reason on [place] [facility] aware.  No current concerns at this time with inappropriate use.  I have refilled for 3 months.
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
## 2018-10-27 ENCOUNTER — Encounter: Payer: BLUE CROSS/BLUE SHIELD | Admitting: Podiatry

## 2018-11-03 NOTE — Progress Notes (Signed)
This encounter was created in error - please disregard.

## 2018-11-09 DIAGNOSIS — Z79890 Hormone replacement therapy (postmenopausal): Secondary | ICD-10-CM | POA: Diagnosis not present

## 2018-11-28 DIAGNOSIS — S63592A Other specified sprain of left wrist, initial encounter: Secondary | ICD-10-CM | POA: Diagnosis not present

## 2018-11-28 DIAGNOSIS — M5412 Radiculopathy, cervical region: Secondary | ICD-10-CM | POA: Diagnosis not present

## 2018-12-05 ENCOUNTER — Telehealth: Payer: BLUE CROSS/BLUE SHIELD | Admitting: Physician Assistant

## 2018-12-05 ENCOUNTER — Ambulatory Visit: Payer: Self-pay | Admitting: Physician Assistant

## 2018-12-05 ENCOUNTER — Other Ambulatory Visit: Payer: Self-pay

## 2018-12-05 VITALS — BP 122/86 | HR 112 | Temp 98.3°F | Resp 16 | Wt 132.6 lb

## 2018-12-05 DIAGNOSIS — R05 Cough: Secondary | ICD-10-CM

## 2018-12-05 DIAGNOSIS — J4520 Mild intermittent asthma, uncomplicated: Secondary | ICD-10-CM

## 2018-12-05 DIAGNOSIS — R0602 Shortness of breath: Secondary | ICD-10-CM

## 2018-12-05 DIAGNOSIS — R0789 Other chest pain: Secondary | ICD-10-CM

## 2018-12-05 DIAGNOSIS — R059 Cough, unspecified: Secondary | ICD-10-CM

## 2018-12-05 MED ORDER — MONTELUKAST SODIUM 10 MG PO TABS: 10.0000 mg | ORAL_TABLET | Freq: Every day | ORAL | 0 refills | Status: DC

## 2018-12-05 MED ORDER — ALBUTEROL SULFATE HFA 108 (90 BASE) MCG/ACT IN AERS: 2.0000 | INHALATION_SPRAY | RESPIRATORY_TRACT | 0 refills | Status: DC | PRN

## 2018-12-05 MED ORDER — PREDNISONE 10 MG (21) PO TBPK: ORAL_TABLET | ORAL | 0 refills | Status: DC

## 2018-12-05 NOTE — Patient Instructions (Addendum)
Thank you for choosing [facility] for your health care needs.  You have been diagnosed with "sensation of chest tightness". Suspect may be reactive airway disease.  Prescribed course of steroid (Sterapred pak) and albuterol inhaler. Use albuterol inhaler, 2-4 puffs, with episodes of chest tightness.  Also, prescribed allergy medication that is good for chest tightness/wheezing.  May use an over the counter antihistamine, such as Claritin or Zyrtec.  Recommend follow-up with pulmonologist for further evaluation of abnormal CXR and discussion of current symptoms.  Follow-up at ED immediately with any worsening symptoms such as worsening shortness of breath, chest tightness, chest pain, etc. Follow-up with urgent care or ED in 2-3 days if current treatment plan does not improve symptoms.  Hope you feel better soon!  Asthma, Adult  Asthma is a long-term (chronic) condition in which the airways get tight and narrow. The airways are the breathing passages that lead from the nose and mouth down into the lungs. A person with asthma will have times when symptoms get worse. These are called asthma attacks. They can cause coughing, whistling sounds when you breathe (wheezing), shortness of breath, and chest pain. They can make it hard to breathe. There is no cure for asthma, but medicines and lifestyle changes can help control it. There are many things that can bring on an asthma attack or make asthma symptoms worse (triggers). Common triggers include:  Mold.  Dust.  Cigarette smoke.  Cockroaches.  Things that can cause allergy symptoms (allergens). These include animal skin flakes (dander) and pollen from trees or grass.  Things that pollute the air. These may include household cleaners, wood smoke, smog, or chemical odors.  Cold air, weather changes, and wind.  Crying or laughing hard.  Stress.  Certain medicines or drugs.  Certain foods such as dried fruit, potato chips, and grape  juice.  Infections, such as a cold or the flu.  Certain medical conditions or diseases.  Exercise or tiring activities. Asthma may be treated with medicines and by staying away from the things that cause asthma attacks. Types of medicines may include:  Controller medicines. These help prevent asthma symptoms. They are usually taken every day.  Fast-acting reliever or rescue medicines. These quickly relieve asthma symptoms. They are used as needed and provide short-term relief.  Allergy medicines if your attacks are brought on by allergens.  Medicines to help control the body's defense (immune) system. Follow these instructions at home: Avoiding triggers in your home  Change your heating and air conditioning filter often.  Limit your use of fireplaces and wood stoves.  Get rid of pests (such as roaches and mice) and their droppings.  Throw away plants if you see mold on them.  Clean your floors. Dust regularly. Use cleaning products that do not smell.  Have someone vacuum when you are not home. Use a vacuum cleaner with a HEPA filter if possible.  Replace carpet with wood, tile, or vinyl flooring. Carpet can trap animal skin flakes and dust.  Use allergy-proof pillows, mattress covers, and box spring covers.  Wash bed sheets and blankets every week in hot water. Dry them in a dryer.  Keep your bedroom free of any triggers.  Avoid pets and keep windows closed when things that cause allergy symptoms are in the air.  Use blankets that are made of polyester or cotton.  Clean bathrooms and kitchens with bleach. If possible, have someone repaint the walls in these rooms with mold-resistant paint. Keep out of the rooms that  are being cleaned and painted.  Wash your hands often with soap and water. If soap and water are not available, use hand sanitizer.  Do not allow anyone to smoke in your home. General instructions  Take over-the-counter and prescription medicines only as  told by your doctor. ? Talk with your doctor if you have questions about how or when to take your medicines. ? Make note if you need to use your medicines more often than usual.  Do not use any products that contain nicotine or tobacco, such as cigarettes and e-cigarettes. If you need help quitting, ask your doctor.  Stay away from secondhand smoke.  Avoid doing things outdoors when allergen counts are high and when air quality is low.  Wear a ski mask when doing outdoor activities in the winter. The mask should cover your nose and mouth. Exercise indoors on cold days if you can.  Warm up before you exercise. Take time to cool down after exercise.  Use a peak flow meter as told by your doctor. A peak flow meter is a tool that measures how well the lungs are working.  Keep track of the peak flow meter's readings. Write them down.  Follow your asthma action plan. This is a written plan for taking care of your asthma and treating your attacks.  Make sure you get all the shots (vaccines) that your doctor recommends. Ask your doctor about a flu shot and a pneumonia shot.  Keep all follow-up visits as told by your doctor. This is important. Contact a doctor if:  You have wheezing, shortness of breath, or a cough even while taking medicine to prevent attacks.  The mucus you cough up (sputum) is thicker than usual.  The mucus you cough up changes from clear or white to yellow, green, gray, or bloody.  You have problems from the medicine you are taking, such as: ? A rash. ? Itching. ? Swelling. ? Trouble breathing.  You need reliever medicines more than 2-3 times a week.  Your peak flow reading is still at 50-79% of your personal best after following the action plan for 1 hour.  You have a fever. Get help right away if:  You seem to be worse and are not responding to medicine during an asthma attack.  You are short of breath even at rest.  You get short of breath when doing  very little activity.  You have trouble eating, drinking, or talking.  You have chest pain or tightness.  You have a fast heartbeat.  Your lips or fingernails start to turn blue.  You are light-headed or dizzy, or you faint.  Your peak flow is less than 50% of your personal best.  You feel too tired to breathe normally. Summary  Asthma is a long-term (chronic) condition in which the airways get tight and narrow. An asthma attack can make it hard to breathe.  Asthma cannot be cured, but medicines and lifestyle changes can help control it.  Make sure you understand how to avoid triggers and how and when to use your medicines. This information is not intended to replace advice given to you by your health care provider. Make sure you discuss any questions you have with your health care provider. Document Released: 02/02/2008 Document Revised: 09/20/2016 Document Reviewed: 09/20/2016 Elsevier Interactive Patient Education  2019 [employer].
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
## 2018-12-05 NOTE — Progress Notes (Signed)
Based on what you shared with me, I feel your condition warrants further evaluation and I recommend that you be seen for a face to face office visit. You have no history of asthma or COPD.  It is hard to know what to make of your symptoms without a proper exam. Please go to the [facility] now to see PA Timmothy Euler, so she can listen to your complaint and do a thorough lung exam.       NOTE: If you entered your credit card information for this eVisit, you will not be charged. You may see a "hold" on your card for the $35 but that hold will drop off and you will not have a charge processed.  If you are having a true medical emergency please call 911.  If you need an urgent face to face visit, [place] has four urgent care centers for your convenience.    PLEASE NOTE: THE INSTACARE LOCATIONS AND URGENT CARE CLINICS DO NOT HAVE THE TESTING FOR CORONAVIRUS COVID19 AVAILABLE.  IF YOU FEEL YOU NEED THIS TEST YOU MUST GO TO A TRIAGE LOCATION AT [email or web address] ?  [email or web address] to reserve your spot online an avoid wait times  [facility] [address], [address] [place], [place] [address] Modified hours of operation: Monday-Friday, 10 AM to 6 PM  Saturday & Sunday 10 AM to 4 PM *Across the street from [place] (New Address!) [address], [place], [place] [address] *Just off [address] across the road from [place] hours of operation: Monday-Friday, 10 AM to 5 PM  Closed Saturday & Sunday   The following sites will take your insurance:  [facility] a Provider at this Location  [place], [place] [address] 10 am to 8 pm Monday-Friday 12 pm to 8 pm [facility] [place] a Provider at this Location   [place] [place], [place], [place] [address] 8 am to 8 pm Monday-Friday 9 am to 6 pm Saturday 11 am to 6 pm Sunday   [facility]  [phone] Get Driving Directions  [address] [place], [place] [address] 8 am to 8 pm Monday-Friday 8 am to 4 pm Saturday-Sunday   Your e-visit answers were reviewed by a board certified advanced clinical practitioner to complete your personal care plan.  Thank you for using e-Visits.  ===View-only below this line===   ----- Message -----    From: Suzanne Cruz    Sent: 12/05/2018  2:23 PM EDT      To: E-Visit Mailing List Subject: E-Visit Submission: Asthma  E-Visit Submission: Asthma --------------------------------  Question: Which of the following are you experiencing? Answer:   coughing            chest tightness            wheezing            shortness of breath  Question: How long have you been having these symptoms? Answer:   7 or more days  Question: How many times in the last month have you experienced similar symptoms? Answer:   More than twice per week  Question: Have you been diagnosed with asthma in the past? Answer:   No  Question: Are you currently taking medication to manage your asthma?  Answer:   No  Question: How often have you needed to use your quick-acting relief medication to relieve symptoms of cough, shortness of breath or chest tightness? Answer:   I do not use a quick-acting relief medication  Question: Have you been unable to participate in school, athletics, exercise or routine daily activities due to symptoms? Answer:   Some limitations  Question: Have you needed any unscheduled care for your asthma, including calling in, an office visit, urgent care or emergency room visit?  Answer:   No  Question: How often has your asthma awakened you at night or early in the morning? Answer:   Less than twice per month  Question: Are you measuring your peak  flow? Answer:   No  Question: Have you recently had: Answer:   Exposure to tobacco, cannabis, smoke, cold or dry air, wood burning stove, cleaning products  Question: Are you currently taking over the counter medications for your symptoms? Answer:   No  Question: Have you taken oral glucocorticoids (steroids) for your asthma in the past year?  Answer:   No  Question: Have you been hospitalized for your asthma? Answer:   No  Question: Have you been admitted to the intensive care unit or been intubated because of your asthma in the past 5 years? Answer:   No  Question: Do you smoke cigarettes, cannabis, vape or electronic cigarettes? Answer:   No  Question: Have you ever noticed an increase in asthma symptoms after taking aspirin or a non-steroidal anti-inflammatory agent (NSAID)? Answer:   No  Question: Please list any additional comments  Answer:   I have been going for a walk at least every other day but have had to omit several times due to these symptoms.  I notice that I am having a tightness in my chest, a slight cough due to a feeling of wheezing in my chest, having shortness of breath. 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 Also if I use scented alcohol gel. I hope this is just allergy issues & not Asthma or something worse. My recent chest X-ray back in February showed some lung hyperinflation.  Question: Please list your medication allergies that you may have ? (If 'none' , please list as 'none') Answer:   SULFA DRUGS  Question: Are you pregnant? Answer:   I am confident that I am not pregnant  Question: Are you breastfeeding? Answer:   No  A total of 5-10 minutes was spent evaluating this patients questionnaire and formulating a plan of care.

## 2018-12-06 ENCOUNTER — Encounter: Payer: Self-pay | Admitting: Family Medicine

## 2018-12-11 DIAGNOSIS — M5412 Radiculopathy, cervical region: Secondary | ICD-10-CM | POA: Diagnosis not present

## 2018-12-11 DIAGNOSIS — M4310 Spondylolisthesis, site unspecified: Secondary | ICD-10-CM | POA: Diagnosis not present

## 2018-12-13 ENCOUNTER — Encounter: Payer: Self-pay | Admitting: Family Medicine

## 2018-12-13 ENCOUNTER — Other Ambulatory Visit: Payer: Self-pay | Admitting: Family

## 2018-12-13 DIAGNOSIS — F909 Attention-deficit hyperactivity disorder, unspecified type: Secondary | ICD-10-CM

## 2018-12-13 NOTE — Telephone Encounter (Signed)
Last OV 10/20/2018  Last refilled adderall 10/20/2018  Next OV no appt scheduled   Sent to PCP for approval

## 2018-12-13 NOTE — Telephone Encounter (Signed)
Please see phone note  

## 2018-12-13 NOTE — Telephone Encounter (Signed)
Patient has not had a refill of omeprazole since 2018. Please see if she has continued this medication and if so has she been getting it over the counter. Please also contact the patients pharmacy and see when she last filled the adderall and see if there is a refill on file. It appears she had a refill sent in in February to be filled this month.

## 2018-12-13 NOTE — Telephone Encounter (Signed)
Refilled: 10/20/2018 Last OV: 10/20/2018 Next OV: not scheduled

## 2018-12-14 ENCOUNTER — Other Ambulatory Visit: Payer: Self-pay | Admitting: Family

## 2018-12-14 DIAGNOSIS — F909 Attention-deficit hyperactivity disorder, unspecified type: Secondary | ICD-10-CM

## 2018-12-15 ENCOUNTER — Telehealth: Payer: Self-pay

## 2018-12-15 DIAGNOSIS — M4310 Spondylolisthesis, site unspecified: Secondary | ICD-10-CM | POA: Diagnosis not present

## 2018-12-15 DIAGNOSIS — M5412 Radiculopathy, cervical region: Secondary | ICD-10-CM | POA: Diagnosis not present

## 2018-12-15 NOTE — Telephone Encounter (Signed)
I believe the patient has refills of this medication available at her pharmacy.  Please contact the pharmacy to confirm this and then let the patient know.  Thanks.

## 2018-12-15 NOTE — Telephone Encounter (Signed)
Noted  

## 2018-12-15 NOTE — Telephone Encounter (Signed)
Called pt's pharmacy and she last picked up an Rx on 10/20/2018 and she does have 2 refills on file for adderall   Sent to PCP as an [place]

## 2018-12-15 NOTE — Telephone Encounter (Signed)
Refilled: 10/20/2018 Last OV: 08/18/2018 Next OV: not scheduled

## 2018-12-18 ENCOUNTER — Encounter: Payer: Self-pay | Admitting: Family Medicine

## 2018-12-18 ENCOUNTER — Other Ambulatory Visit: Payer: Self-pay | Admitting: Family

## 2018-12-18 DIAGNOSIS — F909 Attention-deficit hyperactivity disorder, unspecified type: Secondary | ICD-10-CM

## 2018-12-18 NOTE — Telephone Encounter (Signed)
Sent to PCP for approval to refill omeprazole 40 MG called pt's pharmacy and she had two refills left on her adderall

## 2018-12-19 MED ORDER — OMEPRAZOLE 40 MG PO CPDR: 40.0000 mg | DELAYED_RELEASE_CAPSULE | Freq: Every day | ORAL | 1 refills | Status: AC

## 2018-12-27 NOTE — Telephone Encounter (Signed)
Pt does have a one more refill at [facility] that could be filled on 12/19/2018.

## 2018-12-29 DIAGNOSIS — M5412 Radiculopathy, cervical region: Secondary | ICD-10-CM | POA: Diagnosis not present

## 2019-01-10 ENCOUNTER — Encounter: Payer: Self-pay | Admitting: Family Medicine

## 2019-01-10 ENCOUNTER — Other Ambulatory Visit: Payer: Self-pay

## 2019-01-10 ENCOUNTER — Ambulatory Visit (INDEPENDENT_AMBULATORY_CARE_PROVIDER_SITE_OTHER): Payer: BLUE CROSS/BLUE SHIELD | Admitting: Family Medicine

## 2019-01-10 DIAGNOSIS — R109 Unspecified abdominal pain: Secondary | ICD-10-CM | POA: Insufficient documentation

## 2019-01-10 NOTE — Assessment & Plan Note (Signed)
This could represent a UTI versus muscular strain versus diverticulitis.  She notes mild symptoms at this time.  We will have her come in tomorrow morning for a urinalysis and CBC.  We will determine appropriate antibiotic therapy once she has completed this.  Discussed return precautions.

## 2019-01-10 NOTE — Progress Notes (Signed)
Virtual Visit via telephone Note  This visit type was conducted due to national recommendations for restrictions regarding the COVID-19 pandemic (e.g. social distancing).  This format is felt to be most appropriate for this patient at this time.  All issues noted in this document were discussed and addressed.  No physical exam was performed (except for noted visual exam findings with Video Visits).   I connected with Suzanne Cruz today at  3:15 PM EDT by telephone and verified that I am speaking with the correct person using two identifiers. Location patient: car, not driving Location provider: work Persons participating in the virtual visit: patient, provider  I discussed the limitations, risks, security and privacy concerns of performing an evaluation and management service by telephone and the availability of in person appointments. I also discussed with the patient that there may be a patient responsible charge related to this service. The patient expressed understanding and agreed to proceed.  Reason for visit: Same day visit.  HPI: Flank pain: Patient reports onset of left low back discomfort yesterday radiating around to her left abdomen and left flank.  She noted a stinging sensation in her bladder 2 to 3 days ago.  She notes some discomfort today in the left aspect of her abdomen.  She does report a history of diverticulosis though no diverticulitis.  She has had increased urinary frequency and some urgency.  She notes no dysuria, hematuria, fever, vomiting, diarrhea, or vaginal discharge.  She is status post hysterectomy and left oophorectomy.  She does have a history of kidney stones though she notes this does not feel like a kidney stone.   ROS: See pertinent positives and negatives per HPI.  Past Medical History:  Diagnosis Date  . Chicken pox   . Colon polyps 2015  . Cyst of left kidney   . Depression   . Dysphagia   . Endometriosis   . GERD (gastroesophageal reflux  disease)   . History of gastritis 09/17/2015  . History of kidney stones   . History of stomach ulcers   . IBS (irritable bowel syndrome)   . Migraines     Past Surgical History:  Procedure Laterality Date  . ABDOMINAL HYSTERECTOMY  2001   partial only right ovary is the only thing she as.  Marland Kitchen BREAST SURGERY Left 2005   biopsy  . BUNIONECTOMY Left 2004  . COLONOSCOPY WITH PROPOFOL N/A 03/17/2018   Procedure: COLONOSCOPY WITH PROPOFOL;  Surgeon: [name], MD;  Location: [facility] ENDOSCOPY;  Service: Endoscopy;  Laterality: N/A;  . ESOPHAGOGASTRODUODENOSCOPY (EGD) WITH PROPOFOL N/A 02/10/2015   Procedure: ESOPHAGOGASTRODUODENOSCOPY (EGD) WITH PROPOFOL;  Surgeon: [name], MD;  Location: [facility] ENDOSCOPY;  Service: Endoscopy;  Laterality: N/A;  . ESOPHAGOGASTRODUODENOSCOPY (EGD) WITH PROPOFOL N/A 03/17/2018   Procedure: ESOPHAGOGASTRODUODENOSCOPY (EGD) WITH PROPOFOL;  Surgeon: [name], MD;  Location: [facility] ENDOSCOPY;  Service: Endoscopy;  Laterality: N/A;  . LITHOTRIPSY  1993    Family History  Problem Relation Age of Onset  . Sudden death Brother   . Hypertension Mother   . Colon polyps Mother   . Hypertension Brother   . Stroke Paternal Grandmother     SOCIAL HX: Non-smoker.   Current Outpatient Medications:  .  amphetamine-dextroamphetamine (ADDERALL) 12.5 MG tablet, Take 1 tablet by mouth 2 (two) times daily., Disp: 60 tablet, Rfl: 0 .  cetirizine (ZYRTEC) 10 MG tablet, Take 10 mg by mouth daily as needed for allergies., Disp: , Rfl:  .  cyclobenzaprine (FLEXERIL)  5 MG tablet, Take 1 tablet (5 mg total) by mouth at bedtime as needed for muscle spasms., Disp: 15 tablet, Rfl: 0 .  cycloSPORINE (RESTASIS) 0.05 % ophthalmic emulsion, Restasis 0.05 % eye drops in a dropperette, Disp: , Rfl:  .  montelukast (SINGULAIR) 10 MG tablet, Take 1 tablet (10 mg total) by mouth at bedtime., Disp: 14 tablet, Rfl: 0 .  omeprazole (PRILOSEC) 40 MG capsule, Take 1 capsule (40  mg total) by mouth daily., Disp: 90 capsule, Rfl: 1 .  albuterol (PROVENTIL HFA;VENTOLIN HFA) 108 (90 Base) MCG/ACT inhaler, Inhale 2 puffs into the lungs every 4 (four) hours as needed. (Patient not taking: Reported on 01/10/2019), Disp: 1 Inhaler, Rfl: 0 .  predniSONE (STERAPRED UNI-PAK 21 TAB) 10 MG (21) TBPK tablet, Take 6 pills po qd x 1d, then 5, then 4, then 3, then 2, then 1 (Patient not taking: Reported on 01/10/2019), Disp: 21 tablet, Rfl: 0 .  sucralfate (CARAFATE) 1 GM/10ML suspension, Take 10 mLs (1 g total) by mouth 4 (four) times daily for 7 days., Disp: 420 mL, Rfl: 0  EXAM: This was a telehealth telephone visit and no physical exam was completed.  ASSESSMENT AND PLAN:  Discussed the following assessment and plan:  Flank pain - Plan: CBC with Differential/Platelet, POCT Urinalysis Dipstick  Flank pain This could represent a UTI versus muscular strain versus diverticulitis.  She notes mild symptoms at this time.  We will have her come in tomorrow morning for a urinalysis and CBC.  We will determine appropriate antibiotic therapy once she has completed this.  Discussed return precautions.    I discussed the assessment and treatment plan with the patient. The patient was provided an opportunity to ask questions and all were answered. The patient agreed with the plan and demonstrated an understanding of the instructions.   The patient was advised to call back or seek an in-person evaluation if the symptoms worsen or if the condition fails to improve as anticipated.  I provided 12 minutes of non-face-to-face time during this encounter.   Tommi Rumps, MD

## 2019-01-11 ENCOUNTER — Other Ambulatory Visit: Payer: Self-pay | Admitting: Family Medicine

## 2019-01-11 ENCOUNTER — Other Ambulatory Visit (INDEPENDENT_AMBULATORY_CARE_PROVIDER_SITE_OTHER): Payer: BLUE CROSS/BLUE SHIELD

## 2019-01-11 DIAGNOSIS — R109 Unspecified abdominal pain: Secondary | ICD-10-CM

## 2019-01-11 DIAGNOSIS — R350 Frequency of micturition: Secondary | ICD-10-CM

## 2019-01-11 LAB — CBC WITH DIFFERENTIAL/PLATELET
Basophils Absolute: 0 10*3/uL (ref 0.0–0.1)
Basophils Relative: 0.8 % (ref 0.0–3.0)
Eosinophils Absolute: 0.1 10*3/uL (ref 0.0–0.7)
Eosinophils Relative: 2.8 % (ref 0.0–5.0)
HCT: 42.7 % (ref 36.0–46.0)
Hemoglobin: 14.5 g/dL (ref 12.0–15.0)
Lymphocytes Relative: 34.3 % (ref 12.0–46.0)
Lymphs Abs: 1.8 10*3/uL (ref 0.7–4.0)
MCHC: 33.9 g/dL (ref 30.0–36.0)
MCV: 86.5 fl (ref 78.0–100.0)
Monocytes Absolute: 0.4 10*3/uL (ref 0.1–1.0)
Monocytes Relative: 7.7 % (ref 3.0–12.0)
Neutro Abs: 2.9 10*3/uL (ref 1.4–7.7)
Neutrophils Relative %: 54.4 % (ref 43.0–77.0)
Platelets: 233 10*3/uL (ref 150.0–400.0)
RBC: 4.94 Mil/uL (ref 3.87–5.11)
RDW: 13.1 % (ref 11.5–15.5)
WBC: 5.3 10*3/uL (ref 4.0–10.5)

## 2019-01-11 LAB — POCT URINALYSIS DIPSTICK
Bilirubin, UA: NEGATIVE
Glucose, UA: NEGATIVE
Ketones, UA: NEGATIVE
Leukocytes, UA: NEGATIVE
Nitrite, UA: NEGATIVE
Protein, UA: NEGATIVE
Spec Grav, UA: <=1.005 — AB (ref 1.010–1.025)
Urobilinogen, UA: 0.2 E.U./dL
pH, UA: 5.5 (ref 5.0–8.0)

## 2019-01-11 LAB — URINALYSIS, MICROSCOPIC ONLY

## 2019-01-11 MED ORDER — CEPHALEXIN 500 MG PO CAPS: 500.0000 mg | ORAL_CAPSULE | Freq: Two times a day (BID) | ORAL | 0 refills | Status: DC

## 2019-01-11 NOTE — Addendum Note (Signed)
Addended by: Leeanne Rio on: 01/11/2019 08:49 AM   Modules accepted: Orders

## 2019-01-12 DIAGNOSIS — R293 Abnormal posture: Secondary | ICD-10-CM | POA: Diagnosis not present

## 2019-01-12 DIAGNOSIS — M542 Cervicalgia: Secondary | ICD-10-CM | POA: Diagnosis not present

## 2019-01-12 LAB — URINE CULTURE
MICRO NUMBER:: 474661
SPECIMEN QUALITY:: ADEQUATE

## 2019-01-25 ENCOUNTER — Other Ambulatory Visit: Payer: Self-pay | Admitting: Family Medicine

## 2019-01-25 DIAGNOSIS — R3129 Other microscopic hematuria: Secondary | ICD-10-CM

## 2019-01-26 DIAGNOSIS — M5412 Radiculopathy, cervical region: Secondary | ICD-10-CM | POA: Diagnosis not present

## 2019-01-26 DIAGNOSIS — G894 Chronic pain syndrome: Secondary | ICD-10-CM | POA: Diagnosis not present
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
## 2019-02-05 ENCOUNTER — Encounter: Payer: Self-pay | Admitting: Urology

## 2019-02-05 ENCOUNTER — Ambulatory Visit: Payer: BLUE CROSS/BLUE SHIELD | Admitting: Urology
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
## 2019-02-09 ENCOUNTER — Other Ambulatory Visit: Payer: Self-pay

## 2019-02-09 ENCOUNTER — Ambulatory Visit: Payer: BC Managed Care – PPO | Admitting: Urology

## 2019-02-09 ENCOUNTER — Encounter: Payer: Self-pay | Admitting: Urology

## 2019-02-09 VITALS — BP 114/65 | HR 94 | Ht 66.0 in | Wt 130.0 lb

## 2019-02-09 DIAGNOSIS — R3129 Other microscopic hematuria: Secondary | ICD-10-CM | POA: Diagnosis not present

## 2019-02-09 LAB — MICROSCOPIC EXAMINATION
Bacteria, UA: NONE SEEN
RBC, Urine: NONE SEEN /hpf (ref 0–2)
WBC, UA: NONE SEEN /hpf (ref 0–5)

## 2019-02-09 LAB — URINALYSIS, COMPLETE
Bilirubin, UA: NEGATIVE
Glucose, UA: NEGATIVE
Ketones, UA: NEGATIVE
Leukocytes,UA: NEGATIVE
Nitrite, UA: NEGATIVE
Protein,UA: NEGATIVE
Specific Gravity, UA: >1.03 — ABNORMAL HIGH (ref 1.005–1.030)
Urobilinogen, Ur: 0.2 mg/dL (ref 0.2–1.0)
pH, UA: 7 (ref 5.0–7.5)

## 2019-02-09 MED ORDER — DIAZEPAM 10 MG PO TABS: ORAL_TABLET | ORAL | 0 refills | Status: DC

## 2019-02-10 LAB — BUN+CREAT
BUN/Creatinine Ratio: 14 (ref 9–23)
BUN: 13 mg/dL (ref 6–24)
Creatinine, Ser: 0.91 mg/dL (ref 0.57–1.00)
GFR calc Af Amer: 85 mL/min/{1.73_m2} (ref >59)
GFR calc non Af Amer: 74 mL/min/{1.73_m2} (ref >59)

## 2019-02-11 LAB — URINE CULTURE: Organism ID, Bacteria: NO GROWTH

## 2019-02-16 DIAGNOSIS — M542 Cervicalgia: Secondary | ICD-10-CM | POA: Diagnosis not present

## 2019-02-16 DIAGNOSIS — R293 Abnormal posture: Secondary | ICD-10-CM | POA: Diagnosis not present

## 2019-02-23 DIAGNOSIS — M542 Cervicalgia: Secondary | ICD-10-CM | POA: Diagnosis not present

## 2019-02-23 DIAGNOSIS — R293 Abnormal posture: Secondary | ICD-10-CM | POA: Diagnosis not present

## 2019-03-09 ENCOUNTER — Other Ambulatory Visit: Payer: Self-pay

## 2019-03-09 ENCOUNTER — Ambulatory Visit
Admission: RE | Admit: 2019-03-09 | Discharge: 2019-03-09 | Disposition: A | Discharge status: Home / Self Care | Payer: BC Managed Care – PPO | Source: Ambulatory Visit | Attending: Urology | Admitting: Urology

## 2019-03-09 DIAGNOSIS — R3129 Other microscopic hematuria: Secondary | ICD-10-CM | POA: Insufficient documentation

## 2019-03-23 DIAGNOSIS — M5412 Radiculopathy, cervical region: Secondary | ICD-10-CM | POA: Diagnosis not present

## 2019-03-23 DIAGNOSIS — G894 Chronic pain syndrome: Secondary | ICD-10-CM | POA: Diagnosis not present

## 2019-03-27 ENCOUNTER — Telehealth: Payer: Self-pay | Admitting: Urology

## 2019-03-27 NOTE — Telephone Encounter (Signed)
Patient informed verbalized understanding

## 2019-03-27 NOTE — Telephone Encounter (Signed)
Yes.  She needs to keep the cystoscopy appointment as the kidney ultrasound cannot tell us what is going on in the bladder.

## 2019-03-27 NOTE — Telephone Encounter (Signed)
Pt. Would like to know if she needs to keep her appointment for a Cystoscopy on  Friday. (she told me to ask Larene Beach) Patient states she saw her RUS results on mychart and wasn't sure if she needs to keep The Cysto appointment.

## 2019-03-29 DIAGNOSIS — Z1159 Encounter for screening for other viral diseases: Secondary | ICD-10-CM | POA: Diagnosis not present

## 2019-03-30 ENCOUNTER — Other Ambulatory Visit: Payer: Self-pay

## 2019-03-30 ENCOUNTER — Encounter: Payer: Self-pay | Admitting: Urology

## 2019-03-30 ENCOUNTER — Ambulatory Visit: Payer: BC Managed Care – PPO | Admitting: Urology

## 2019-03-30 VITALS — BP 101/66 | HR 111 | Ht 66.0 in | Wt 130.0 lb

## 2019-03-30 DIAGNOSIS — R3129 Other microscopic hematuria: Secondary | ICD-10-CM

## 2019-03-30 DIAGNOSIS — G5601 Carpal tunnel syndrome, right upper limb: Secondary | ICD-10-CM | POA: Diagnosis not present

## 2019-03-30 LAB — URINALYSIS, COMPLETE
Bilirubin, UA: NEGATIVE
Glucose, UA: NEGATIVE
Ketones, UA: NEGATIVE
Nitrite, UA: NEGATIVE
Protein,UA: NEGATIVE
Specific Gravity, UA: 1.025 (ref 1.005–1.030)
Urobilinogen, Ur: 0.2 mg/dL (ref 0.2–1.0)
pH, UA: 5.5 (ref 5.0–7.5)

## 2019-03-30 LAB — MICROSCOPIC EXAMINATION: Epithelial Cells (non renal): >10 /hpf — AB (ref 0–10)

## 2019-03-30 MED ORDER — LIDOCAINE HCL URETHRAL/MUCOSAL 2 % EX GEL: 1.0000 "application " | Freq: Once | CUTANEOUS | Status: AC

## 2019-03-30 NOTE — Progress Notes (Signed)
   03/30/19  CC:  Chief Complaint  Patient presents with  . Cysto    HPI: 53 year old female recently seen by [facility] for asymptomatic microhematuria.  She had a CT of the abdomen pelvis with contrast January 2020 which showed no significant upper tract abnormalities.  A renal ultrasound performed on 03/09/2019 was unremarkable.  She has no complaints today.  Blood pressure 101/66, pulse (!) 111, height 5\' 6"  (1.676 m), weight 130 lb (59 kg). NED. A&Ox3.   No respiratory distress   Abd soft, NT, ND Normal external genitalia with patent urethral meatus  Cystoscopy Procedure Note  Patient identification was confirmed, informed consent was obtained, and patient was prepped using Betadine solution.  Lidocaine jelly was administered per urethral meatus.    Procedure: - Flexible cystoscope introduced, without any difficulty.   - Thorough search of the bladder revealed:    normal urethral meatus    normal urothelium    no stones    no ulcers     no tumors    no urethral polyps    no trabeculation  - Ureteral orifices were normal in position and appearance.  Post-Procedure: - Patient tolerated the procedure well  Assessment/ Plan: No abnormalities identified on cystoscopy.  Recommend a follow-up appointment 6-9 months with [facility] for a repeat urinalysis.   Abbie Sons, MD

## 2019-04-17 ENCOUNTER — Other Ambulatory Visit: Payer: Self-pay | Admitting: Family

## 2019-04-17 DIAGNOSIS — F909 Attention-deficit hyperactivity disorder, unspecified type: Secondary | ICD-10-CM

## 2019-04-20 ENCOUNTER — Other Ambulatory Visit: Payer: Self-pay | Admitting: Family

## 2019-04-20 ENCOUNTER — Encounter: Payer: Self-pay | Admitting: Family Medicine

## 2019-04-20 DIAGNOSIS — R1013 Epigastric pain: Secondary | ICD-10-CM | POA: Diagnosis not present

## 2019-04-20 DIAGNOSIS — K5909 Other constipation: Secondary | ICD-10-CM | POA: Diagnosis not present

## 2019-04-20 DIAGNOSIS — K5790 Diverticulosis of intestine, part unspecified, without perforation or abscess without bleeding: Secondary | ICD-10-CM | POA: Diagnosis not present

## 2019-04-20 DIAGNOSIS — R1032 Left lower quadrant pain: Secondary | ICD-10-CM | POA: Diagnosis not present

## 2019-04-20 DIAGNOSIS — K5900 Constipation, unspecified: Secondary | ICD-10-CM | POA: Diagnosis not present

## 2019-04-20 DIAGNOSIS — F909 Attention-deficit hyperactivity disorder, unspecified type: Secondary | ICD-10-CM

## 2019-04-20 NOTE — Telephone Encounter (Signed)
Refilled: 10/20/2018 Last OV: 01/10/2019 Next OV: not scheduled

## 2019-04-20 NOTE — Telephone Encounter (Signed)
She needs follow-up scheduled to receive a refill on this medication. Once scheduled I will send in a refill.

## 2019-04-20 NOTE — Telephone Encounter (Signed)
Please see the other refill request.

## 2019-04-23 ENCOUNTER — Ambulatory Visit (INDEPENDENT_AMBULATORY_CARE_PROVIDER_SITE_OTHER): Payer: BC Managed Care – PPO | Admitting: Family Medicine

## 2019-04-23 ENCOUNTER — Encounter: Payer: Self-pay | Admitting: Family Medicine

## 2019-04-23 ENCOUNTER — Other Ambulatory Visit: Payer: Self-pay | Admitting: Gastroenterology

## 2019-04-23 ENCOUNTER — Other Ambulatory Visit: Payer: Self-pay

## 2019-04-23 VITALS — Ht 66.0 in | Wt 128.0 lb

## 2019-04-23 DIAGNOSIS — R3129 Other microscopic hematuria: Secondary | ICD-10-CM

## 2019-04-23 DIAGNOSIS — R1013 Epigastric pain: Secondary | ICD-10-CM

## 2019-04-23 DIAGNOSIS — R1310 Dysphagia, unspecified: Secondary | ICD-10-CM

## 2019-04-23 DIAGNOSIS — R7303 Prediabetes: Secondary | ICD-10-CM

## 2019-04-23 DIAGNOSIS — F909 Attention-deficit hyperactivity disorder, unspecified type: Secondary | ICD-10-CM | POA: Diagnosis not present

## 2019-04-23 DIAGNOSIS — K219 Gastro-esophageal reflux disease without esophagitis: Secondary | ICD-10-CM

## 2019-04-23 DIAGNOSIS — R1314 Dysphagia, pharyngoesophageal phase: Secondary | ICD-10-CM

## 2019-04-23 NOTE — Telephone Encounter (Signed)
Seen in office.

## 2019-04-23 NOTE — Assessment & Plan Note (Signed)
Stable. Refill adderall. Controlled substance database reviewed.

## 2019-04-23 NOTE — Assessment & Plan Note (Signed)
She will complete evaluation through GI.

## 2019-04-23 NOTE — Assessment & Plan Note (Signed)
Continue omeprazole. Complete evaluation as planned through GI.

## 2019-04-23 NOTE — Assessment & Plan Note (Signed)
Evaluation completed through urology. She will continue to follow with them.

## 2019-04-23 NOTE — Progress Notes (Signed)
Virtual Visit via video Note  This visit type was conducted due to national recommendations for restrictions regarding the COVID-19 pandemic (e.g. social distancing).  This format is felt to be most appropriate for this patient at this time.  All issues noted in this document were discussed and addressed.  No physical exam was performed (except for noted visual exam findings with Video Visits).   I connected with Suzanne Cruz today at  9:00 AM EDT by a video enabled telemedicine application and verified that I am speaking with the correct person using two identifiers. Location patient: work Location provider: work  Persons participating in the virtual visit: patient, provider  I discussed the limitations, risks, security and privacy concerns of performing an evaluation and management service by telephone and the availability of in person appointments. I also discussed with the patient that there may be a patient responsible charge related to this service. The patient expressed understanding and agreed to proceed.   Reason for visit: follow-up  HPI: ADHD: Taking Adderall during the week when she works.  It is beneficial.  She did have minimal appetite suppression previously though that has been much less recently.  No sleep abnormalities or palpitations.  Prediabetes: No polyuria or polydipsia.  She has cut carbs and sugar down in her diet.  She is exercising most days of the week with biking or walking.  GERD: Continues on omeprazole.  She does have reflux issues.  She has had some issues with left lower quadrant abdominal pain.  No blood in her stool.  She does have issues with dysphagia that has been chronic.  She did have a little aspiration event about a week ago with some water and she has had minimal cough since then.  She has been following with GI and saw them on Friday and they plan to do a barium swallow and upper GI series.  Microhematuria: She continues to follow with urology.   Renal ultrasound was normal.  Cystoscopy did not reveal any abnormalities.  HER-2 most recent microscopic exams did not reveal any RBCs.   ROS: See pertinent positives and negatives per HPI.  Past Medical History:  Diagnosis Date  . Chicken pox   . Colon polyps 2015  . Cyst of left kidney   . Depression   . Dysphagia   . Endometriosis   . GERD (gastroesophageal reflux disease)   . History of gastritis 09/17/2015  . History of kidney stones   . History of stomach ulcers   . IBS (irritable bowel syndrome)   . Migraines     Past Surgical History:  Procedure Laterality Date  . ABDOMINAL HYSTERECTOMY  2001   partial only right ovary is the only thing she as.  Marland Kitchen BREAST SURGERY Left 2005   biopsy  . BUNIONECTOMY Left 2004  . COLONOSCOPY WITH PROPOFOL N/A 03/17/2018   Procedure: COLONOSCOPY WITH PROPOFOL;  Surgeon: [name], MD;  Location: [facility] ENDOSCOPY;  Service: Endoscopy;  Laterality: N/A;  . ESOPHAGOGASTRODUODENOSCOPY (EGD) WITH PROPOFOL N/A 02/10/2015   Procedure: ESOPHAGOGASTRODUODENOSCOPY (EGD) WITH PROPOFOL;  Surgeon: [name], MD;  Location[facility] ENDOSCOPY;  Service: Endoscopy;  Laterality: N/A;  . ESOPHAGOGASTRODUODENOSCOPY (EGD) WITH PROPOFOL N/A 03/17/2018   Procedure: ESOPHAGOGASTRODUODENOSCOPY (EGD) WITH PROPOFOL;  Surgeon: [name], MD;  Location[facility] ENDOSCOPY;  Service: Endoscopy;  Laterality: N/A;  . LITHOTRIPSY  1993    Family History  Problem Relation Age of Onset  . Sudden death Brother   . Hypertension Mother   .  Colon polyps Mother   . Hypertension Brother   . Stroke Paternal Grandmother     SOCIAL HX: Non-smoker.   Current Outpatient Medications:  .  albuterol (PROAIR HFA) 108 (90 Base) MCG/ACT inhaler, ProAir HFA 90 mcg/actuation aerosol inhaler  INHALE 2 PUFFS INTO THE LUNGS EVERY 4 HOURS AS NEEDED, Disp: , Rfl:  .  amphetamine-dextroamphetamine (ADDERALL) 12.5 MG tablet, Take 1 tablet by mouth 2 (two) times daily., Disp: 60  tablet, Rfl: 0 .  cetirizine (ZYRTEC) 10 MG tablet, Take 10 mg by mouth daily as needed for allergies., Disp: , Rfl:  .  cyclobenzaprine (FLEXERIL) 5 MG tablet, Take 1 tablet (5 mg total) by mouth at bedtime as needed for muscle spasms., Disp: 15 tablet, Rfl: 0 .  cycloSPORINE (RESTASIS) 0.05 % ophthalmic emulsion, Restasis 0.05 % eye drops in a dropperette, Disp: , Rfl:  .  diazepam (VALIUM) 10 MG tablet, Take on tablet 30 minutes prior to the cystoscopy, Disp: 1 tablet, Rfl: 0 .  diclofenac (VOLTAREN) 75 MG EC tablet, diclofenac sodium 75 mg tablet,delayed release  TAKE 1 TABLET BY MOUTH TWICE A DAY, Disp: , Rfl:  .  gabapentin (NEURONTIN) 100 MG capsule, gabapentin 100 mg capsule  1 PO QHS FOR 5 DAYS THEN 1 PO BID, Disp: , Rfl:  .  omeprazole (PRILOSEC) 40 MG capsule, Take 1 capsule (40 mg total) by mouth daily., Disp: 90 capsule, Rfl: 1  Current Facility-Administered Medications:  .  lidocaine (XYLOCAINE) 2 % jelly 1 application, 1 application, Urethral, Once, Stoioff, Ronda Fairly, MD  EXAM:  VITALS per patient if applicable: None.  GENERAL: alert, oriented, appears well and in no acute distress  HEENT: atraumatic, conjunttiva clear, no obvious abnormalities on inspection of external nose and ears  NECK: normal movements of the head and neck  LUNGS: on inspection no signs of respiratory distress, breathing rate appears normal, no obvious gross SOB, gasping or wheezing  CV: no obvious cyanosis  MS: moves all visible extremities without noticeable abnormality  PSYCH/NEURO: pleasant and cooperative, no obvious depression or anxiety, speech and thought processing grossly intact  ASSESSMENT AND PLAN:  Discussed the following assessment and plan:  Dysphagia She will complete evaluation through GI.   GERD (gastroesophageal reflux disease) Continue omeprazole. Complete evaluation as planned through GI.   Adult ADHD Stable. Refill adderall. Controlled substance database reviewed.    Microhematuria Evaluation completed through urology. She will continue to follow with them.     I discussed the assessment and treatment plan with the patient. The patient was provided an opportunity to ask questions and all were answered. The patient agreed with the plan and demonstrated an understanding of the instructions.   The patient was advised to call back or seek an in-person evaluation if the symptoms worsen or if the condition fails to improve as anticipated.    Tommi Rumps, MD

## 2019-04-24 ENCOUNTER — Other Ambulatory Visit (INDEPENDENT_AMBULATORY_CARE_PROVIDER_SITE_OTHER): Payer: BC Managed Care – PPO

## 2019-04-24 ENCOUNTER — Other Ambulatory Visit: Payer: Self-pay

## 2019-04-24 ENCOUNTER — Encounter: Payer: Self-pay | Admitting: Family Medicine

## 2019-04-24 DIAGNOSIS — R7303 Prediabetes: Secondary | ICD-10-CM | POA: Diagnosis not present

## 2019-04-24 DIAGNOSIS — F909 Attention-deficit hyperactivity disorder, unspecified type: Secondary | ICD-10-CM

## 2019-04-24 MED ORDER — AMPHETAMINE-DEXTROAMPHETAMINE 12.5 MG PO TABS: 12.5000 mg | ORAL_TABLET | Freq: Two times a day (BID) | ORAL | 0 refills | Status: DC

## 2019-04-25 LAB — COMPREHENSIVE METABOLIC PANEL
ALT: 12 U/L (ref 0–35)
AST: 16 U/L (ref 0–37)
Albumin: 4.7 g/dL (ref 3.5–5.2)
Alkaline Phosphatase: 82 U/L (ref 39–117)
BUN: 15 mg/dL (ref 6–23)
CO2: 28 mEq/L (ref 19–32)
Calcium: 9.9 mg/dL (ref 8.4–10.5)
Chloride: 103 mEq/L (ref 96–112)
Creatinine, Ser: 0.92 mg/dL (ref 0.40–1.20)
GFR: 64.38 mL/min (ref >60.00)
Glucose, Bld: 85 mg/dL (ref 70–99)
Potassium: 4.2 mEq/L (ref 3.5–5.1)
Sodium: 139 mEq/L (ref 135–145)
Total Bilirubin: 0.4 mg/dL (ref 0.2–1.2)
Total Protein: 7.2 g/dL (ref 6.0–8.3)

## 2019-04-25 LAB — LIPID PANEL
Cholesterol: 227 mg/dL — ABNORMAL HIGH (ref 0–200)
HDL: 69.4 mg/dL (ref >39.00)
LDL Cholesterol: 135 mg/dL — ABNORMAL HIGH (ref 0–99)
NonHDL: 158.02
Total CHOL/HDL Ratio: 3
Triglycerides: 114 mg/dL (ref 0.0–149.0)
VLDL: 22.8 mg/dL (ref 0.0–40.0)

## 2019-04-25 LAB — HEMOGLOBIN A1C: Hgb A1c MFr Bld: 5.8 % (ref 4.6–6.5)

## 2019-04-27 NOTE — Telephone Encounter (Signed)
Called and left a message that the pateint needed an appointment before getting the medication Adderall.  Nina,cma

## 2019-04-30 DIAGNOSIS — M5412 Radiculopathy, cervical region: Secondary | ICD-10-CM | POA: Diagnosis not present

## 2019-05-15 ENCOUNTER — Other Ambulatory Visit: Payer: Self-pay | Admitting: Gastroenterology

## 2019-05-15 DIAGNOSIS — R1013 Epigastric pain: Secondary | ICD-10-CM

## 2019-05-15 DIAGNOSIS — M7052 Other bursitis of knee, left knee: Secondary | ICD-10-CM | POA: Diagnosis not present

## 2019-05-15 DIAGNOSIS — R1314 Dysphagia, pharyngoesophageal phase: Secondary | ICD-10-CM

## 2019-05-15 DIAGNOSIS — M7061 Trochanteric bursitis, right hip: Secondary | ICD-10-CM | POA: Diagnosis not present

## 2019-05-18 ENCOUNTER — Ambulatory Visit
Admission: RE | Admit: 2019-05-18 | Discharge: 2019-05-18 | Disposition: A | Discharge status: Home / Self Care | Payer: BC Managed Care – PPO | Source: Ambulatory Visit | Attending: Gastroenterology | Admitting: Gastroenterology

## 2019-05-18 ENCOUNTER — Other Ambulatory Visit: Payer: Self-pay

## 2019-05-18 DIAGNOSIS — R1314 Dysphagia, pharyngoesophageal phase: Secondary | ICD-10-CM

## 2019-05-18 DIAGNOSIS — K449 Diaphragmatic hernia without obstruction or gangrene: Secondary | ICD-10-CM | POA: Diagnosis not present

## 2019-05-18 DIAGNOSIS — R1013 Epigastric pain: Secondary | ICD-10-CM | POA: Diagnosis not present

## 2019-06-01 ENCOUNTER — Ambulatory Visit (INDEPENDENT_AMBULATORY_CARE_PROVIDER_SITE_OTHER): Payer: BC Managed Care – PPO

## 2019-06-01 ENCOUNTER — Other Ambulatory Visit: Payer: Self-pay

## 2019-06-01 DIAGNOSIS — Z23 Encounter for immunization: Secondary | ICD-10-CM | POA: Diagnosis not present

## 2019-06-01 DIAGNOSIS — M5412 Radiculopathy, cervical region: Secondary | ICD-10-CM | POA: Diagnosis not present

## 2019-06-01 DIAGNOSIS — M7061 Trochanteric bursitis, right hip: Secondary | ICD-10-CM | POA: Diagnosis not present

## 2019-06-01 DIAGNOSIS — G894 Chronic pain syndrome: Secondary | ICD-10-CM | POA: Diagnosis not present

## 2019-07-02 ENCOUNTER — Encounter: Payer: Self-pay | Admitting: Family Medicine

## 2019-07-02 ENCOUNTER — Other Ambulatory Visit: Payer: Self-pay | Admitting: Family Medicine

## 2019-07-02 DIAGNOSIS — F909 Attention-deficit hyperactivity disorder, unspecified type: Secondary | ICD-10-CM

## 2019-07-03 MED ORDER — AMPHETAMINE-DEXTROAMPHETAMINE 12.5 MG PO TABS: 12.5000 mg | ORAL_TABLET | Freq: Two times a day (BID) | ORAL | 0 refills | Status: DC

## 2019-08-03 ENCOUNTER — Ambulatory Visit: Payer: BC Managed Care – PPO | Admitting: Family Medicine

## 2019-08-15 DIAGNOSIS — R300 Dysuria: Secondary | ICD-10-CM | POA: Diagnosis not present

## 2019-08-22 DIAGNOSIS — M5412 Radiculopathy, cervical region: Secondary | ICD-10-CM | POA: Diagnosis not present

## 2019-08-22 DIAGNOSIS — G894 Chronic pain syndrome: Secondary | ICD-10-CM | POA: Diagnosis not present

## 2019-08-22 DIAGNOSIS — M7061 Trochanteric bursitis, right hip: Secondary | ICD-10-CM | POA: Diagnosis not present

## 2019-08-28 DIAGNOSIS — R1013 Epigastric pain: Secondary | ICD-10-CM | POA: Diagnosis not present

## 2019-08-28 DIAGNOSIS — K5790 Diverticulosis of intestine, part unspecified, without perforation or abscess without bleeding: Secondary | ICD-10-CM | POA: Diagnosis not present

## 2019-08-28 DIAGNOSIS — R197 Diarrhea, unspecified: Secondary | ICD-10-CM | POA: Diagnosis not present

## 2019-08-28 DIAGNOSIS — R1031 Right lower quadrant pain: Secondary | ICD-10-CM | POA: Diagnosis not present

## 2019-08-28 DIAGNOSIS — R1032 Left lower quadrant pain: Secondary | ICD-10-CM | POA: Diagnosis not present

## 2019-08-29 ENCOUNTER — Encounter: Payer: Self-pay | Admitting: Family Medicine

## 2019-08-29 ENCOUNTER — Ambulatory Visit (INDEPENDENT_AMBULATORY_CARE_PROVIDER_SITE_OTHER): Payer: BC Managed Care – PPO | Admitting: Family Medicine

## 2019-08-29 ENCOUNTER — Other Ambulatory Visit: Payer: Self-pay

## 2019-08-29 DIAGNOSIS — F909 Attention-deficit hyperactivity disorder, unspecified type: Secondary | ICD-10-CM | POA: Diagnosis not present

## 2019-08-29 DIAGNOSIS — R7303 Prediabetes: Secondary | ICD-10-CM

## 2019-08-29 DIAGNOSIS — F329 Major depressive disorder, single episode, unspecified: Secondary | ICD-10-CM

## 2019-08-29 DIAGNOSIS — R197 Diarrhea, unspecified: Secondary | ICD-10-CM | POA: Diagnosis not present

## 2019-08-29 DIAGNOSIS — R1031 Right lower quadrant pain: Secondary | ICD-10-CM | POA: Diagnosis not present

## 2019-08-29 DIAGNOSIS — M25559 Pain in unspecified hip: Secondary | ICD-10-CM | POA: Insufficient documentation

## 2019-08-29 DIAGNOSIS — R1032 Left lower quadrant pain: Secondary | ICD-10-CM | POA: Diagnosis not present

## 2019-08-29 DIAGNOSIS — R1013 Epigastric pain: Secondary | ICD-10-CM | POA: Diagnosis not present

## 2019-08-29 DIAGNOSIS — F419 Anxiety disorder, unspecified: Secondary | ICD-10-CM | POA: Diagnosis not present

## 2019-08-29 DIAGNOSIS — F32A Depression, unspecified: Secondary | ICD-10-CM

## 2019-08-29 MED ORDER — AMPHETAMINE-DEXTROAMPHETAMINE 12.5 MG PO TABS: 12.5000 mg | ORAL_TABLET | Freq: Two times a day (BID) | ORAL | 0 refills | Status: DC

## 2019-08-29 MED ORDER — FLUOXETINE HCL 20 MG PO TABS: 20.0000 mg | ORAL_TABLET | Freq: Every day | ORAL | 3 refills | Status: AC

## 2019-08-29 NOTE — Assessment & Plan Note (Signed)
Adequately controlled.  Refill of Adderall given.  Controlled substance database reviewed.  Discussed she could try halfing her dose in the afternoon or if sleep is more of an issue moving forward we could decrease her dose.

## 2019-08-29 NOTE — Progress Notes (Signed)
Virtual Visit via video Note  This visit type was conducted due to national recommendations for restrictions regarding the COVID-19 pandemic (e.g. social distancing).  This format is felt to be most appropriate for this patient at this time.  All issues noted in this document were discussed and addressed.  No physical exam was performed (except for noted visual exam findings with Video Visits).   I connected with Suzanne Cruz today at 11:30 AM EST by a video enabled telemedicine application and verified that I am speaking with the correct person using two identifiers. Location patient: Conservator, museum/gallery provider: work Persons participating in the virtual visit: patient, provider, Emmajane Fryer (husband)  I discussed the limitations, risks, security and privacy concerns of performing an evaluation and management service by telephone and the availability of in person appointments. I also discussed with the patient that there may be a patient responsible charge related to this service. The patient expressed understanding and agreed to proceed.   Reason for visit: Follow-up.  HPI: ADHD: Taking Adderall.  Does not take Adderall daily.  Typically takes it when she works.  No appetite suppression.  No palpitations.  She does note the afternoon dose does occasionally affect her sleep.  Oftentimes she will take that dose.  Anxiety/depression: Patient notes she has some issues with anxiety and depression that comes and goes.  She will intermittently take Prozac and thinks this may be out of date.  She knows she needs to take it daily for to be effective though only occasionally takes it.  No triggers for her anxiety or depression.  Prediabetes: Diet has not been great recently.  Has been eating sweets and bad stuff.  Not exercising much either.  She does note some nighttime polydipsia though that has been going on for quite some time.  Prior A1c was 5.8.  No polyuria.  Hip pain: Patient notes this resolved  after she stopped running.  She did see orthopedics for bursitis.   ROS: See pertinent positives and negatives per HPI.  Past Medical History:  Diagnosis Date  . Chicken pox   . Colon polyps 2015  . Cyst of left kidney   . Depression   . Dysphagia   . Endometriosis   . GERD (gastroesophageal reflux disease)   . History of gastritis 09/17/2015  . History of kidney stones   . History of stomach ulcers   . IBS (irritable bowel syndrome)   . Migraines     Past Surgical History:  Procedure Laterality Date  . ABDOMINAL HYSTERECTOMY  2001   partial only right ovary is the only thing she as.  Marland Kitchen BREAST SURGERY Left 2005   biopsy  . BUNIONECTOMY Left 2004  . COLONOSCOPY WITH PROPOFOL N/A 03/17/2018   Procedure: COLONOSCOPY WITH PROPOFOL;  Surgeon: [name], MD;  Location[facility] ENDOSCOPY;  Service: Endoscopy;  Laterality: N/A;  . ESOPHAGOGASTRODUODENOSCOPY (EGD) WITH PROPOFOL N/A 02/10/2015   Procedure: ESOPHAGOGASTRODUODENOSCOPY (EGD) WITH PROPOFOL;  Surgeon: [name], MD;  Location: [facility] ENDOSCOPY;  Service: Endoscopy;  Laterality: N/A;  . ESOPHAGOGASTRODUODENOSCOPY (EGD) WITH PROPOFOL N/A 03/17/2018   Procedure: ESOPHAGOGASTRODUODENOSCOPY (EGD) WITH PROPOFOL;  Surgeon: [name], MD;  Location: [facility] ENDOSCOPY;  Service: Endoscopy;  Laterality: N/A;  . LITHOTRIPSY  1993    Family History  Problem Relation Age of Onset  . Sudden death Brother   . Hypertension Mother   . Colon polyps Mother   . Hypertension Brother   . Stroke Paternal Grandmother  SOCIAL HX: Non-smoker   Current Outpatient Medications:  .  albuterol (PROAIR HFA) 108 (90 Base) MCG/ACT inhaler, ProAir HFA 90 mcg/actuation aerosol inhaler  INHALE 2 PUFFS INTO THE LUNGS EVERY 4 HOURS AS NEEDED, Disp: , Rfl:  .  amphetamine-dextroamphetamine (ADDERALL) 12.5 MG tablet, Take 1 tablet by mouth 2 (two) times daily., Disp: 60 tablet, Rfl: 0 .  cetirizine (ZYRTEC) 10 MG tablet, Take 10 mg by  mouth daily as needed for allergies., Disp: , Rfl:  .  cyclobenzaprine (FLEXERIL) 5 MG tablet, Take 1 tablet (5 mg total) by mouth at bedtime as needed for muscle spasms., Disp: 15 tablet, Rfl: 0 .  cycloSPORINE (RESTASIS) 0.05 % ophthalmic emulsion, Restasis 0.05 % eye drops in a dropperette, Disp: , Rfl:  .  diclofenac (VOLTAREN) 75 MG EC tablet, diclofenac sodium 75 mg tablet,delayed release  TAKE 1 TABLET BY MOUTH TWICE A DAY, Disp: , Rfl:  .  gabapentin (NEURONTIN) 100 MG capsule, gabapentin 100 mg capsule  1 PO QHS FOR 5 DAYS THEN 1 PO BID, Disp: , Rfl:  .  hyoscyamine (LEVSIN) 0.125 MG tablet, Take by mouth., Disp: , Rfl:  .  meloxicam (MOBIC) 15 MG tablet, Take by mouth., Disp: , Rfl:  .  omeprazole (PRILOSEC) 40 MG capsule, Take 1 capsule (40 mg total) by mouth daily., Disp: 90 capsule, Rfl: 1 .  FLUoxetine (PROZAC) 20 MG tablet, Take 1 tablet (20 mg total) by mouth daily., Disp: 30 tablet, Rfl: 3  Current Facility-Administered Medications:  .  lidocaine (XYLOCAINE) 2 % jelly 1 application, 1 application, Urethral, Once, Stoioff, Scott C, MD  EXAM:  VITALS per patient if applicable:  GENERAL: alert, oriented, appears well and in no acute distress  HEENT: atraumatic, conjunttiva clear, no obvious abnormalities on inspection of external nose and ears  NECK: normal movements of the head and neck  LUNGS: on inspection no signs of respiratory distress, breathing rate appears normal, no obvious gross SOB, gasping or wheezing  CV: no obvious cyanosis  MS: moves all visible extremities without noticeable abnormality  PSYCH/NEURO: pleasant and cooperative, no obvious depression or anxiety, speech and thought processing grossly intact  ASSESSMENT AND PLAN:  Discussed the following assessment and plan:  Adult ADHD Adequately controlled.  Refill of Adderall given.  Controlled substance database reviewed.  Discussed she could try halfing her dose in the afternoon or if sleep is more  of an issue moving forward we could decrease her dose.  Anxiety and depression Chronic intermittent issues related to this.  We will restart Prozac.  Discussed seeking medical attention the ED if she develops suicidal ideation.  Follow-up in 3 months.  Hip pain Resolved.  She will monitor for recurrence.  Prediabetes Some nighttime polydipsia though this has been a chronic issue and may be related to dry mouth from her Adderall.  We will plan on A1c at next visit.  Encouraged diet and exercise.    I discussed the assessment and treatment plan with the patient. The patient was provided an opportunity to ask questions and all were answered. The patient agreed with the plan and demonstrated an understanding of the instructions.   The patient was advised to call back or seek an in-person evaluation if the symptoms worsen or if the condition fails to improve as anticipated.   Tommi Rumps, MD

## 2019-08-29 NOTE — Assessment & Plan Note (Signed)
Some nighttime polydipsia though this has been a chronic issue and may be related to dry mouth from her Adderall.  We will plan on A1c at next visit.  Encouraged diet and exercise.

## 2019-08-29 NOTE — Assessment & Plan Note (Signed)
Resolved.  She will monitor for recurrence. 

## 2019-08-29 NOTE — Assessment & Plan Note (Signed)
Chronic intermittent issues related to this.  We will restart Prozac.  Discussed seeking medical attention the ED if she develops suicidal ideation.  Follow-up in 3 months.

## 2019-09-06 ENCOUNTER — Telehealth: Payer: Self-pay | Admitting: Family Medicine

## 2019-09-06 DIAGNOSIS — F909 Attention-deficit hyperactivity disorder, unspecified type: Secondary | ICD-10-CM

## 2019-09-06 MED ORDER — AMPHETAMINE-DEXTROAMPHETAMINE 12.5 MG PO TABS: 12.5000 mg | ORAL_TABLET | Freq: Two times a day (BID) | ORAL | 0 refills | Status: DC

## 2019-09-06 NOTE — Addendum Note (Signed)
Addended by: Fulton Mole D on: 09/06/2019 04:54 PM   Modules accepted: Orders

## 2019-09-06 NOTE — Telephone Encounter (Signed)
[facility] [address] [facility] amphetamine-dextroamphetamine (ADDERALL) 12.5 MG tablet

## 2019-09-07 MED ORDER — AMPHETAMINE-DEXTROAMPHETAMINE 12.5 MG PO TABS: 12.5000 mg | ORAL_TABLET | Freq: Two times a day (BID) | ORAL | 0 refills | Status: DC

## 2019-09-07 NOTE — Telephone Encounter (Signed)
Sent to pharmacy 

## 2019-09-07 NOTE — Addendum Note (Signed)
Addended by: Leone Haven on: 09/07/2019 05:09 PM   Modules accepted: Orders

## 2019-09-07 NOTE — Telephone Encounter (Signed)
Pt's called her pharmacy is out of that medication, but [facility] [address] [facility] the only pharmacy that has it. [facility] [address], [place].

## 2019-09-07 NOTE — Telephone Encounter (Signed)
Pt's called her pharmacy is out of that medication, but [facility] [address] Is the only pharmacy that has it. [facility] [address] [place], [place].  Suzanne Cruz,cma

## 2019-09-10 DIAGNOSIS — B349 Viral infection, unspecified: Secondary | ICD-10-CM | POA: Diagnosis not present

## 2019-09-10 DIAGNOSIS — Z20822 Contact with and (suspected) exposure to covid-19: Secondary | ICD-10-CM | POA: Diagnosis not present

## 2019-09-12 DIAGNOSIS — Z20822 Contact with and (suspected) exposure to covid-19: Secondary | ICD-10-CM | POA: Diagnosis not present

## 2019-09-16 DIAGNOSIS — Z20822 Contact with and (suspected) exposure to covid-19: Secondary | ICD-10-CM | POA: Diagnosis not present

## 2019-09-21 ENCOUNTER — Ambulatory Visit (INDEPENDENT_AMBULATORY_CARE_PROVIDER_SITE_OTHER): Payer: BC Managed Care – PPO

## 2019-09-21 ENCOUNTER — Other Ambulatory Visit: Payer: Self-pay | Admitting: Podiatry

## 2019-09-21 ENCOUNTER — Ambulatory Visit: Payer: BC Managed Care – PPO | Admitting: Podiatry

## 2019-09-21 ENCOUNTER — Other Ambulatory Visit: Payer: Self-pay

## 2019-09-21 DIAGNOSIS — M2012 Hallux valgus (acquired), left foot: Secondary | ICD-10-CM

## 2019-09-21 DIAGNOSIS — M19072 Primary osteoarthritis, left ankle and foot: Secondary | ICD-10-CM | POA: Diagnosis not present

## 2019-09-21 DIAGNOSIS — M21611 Bunion of right foot: Secondary | ICD-10-CM | POA: Diagnosis not present

## 2019-09-21 DIAGNOSIS — M21612 Bunion of left foot: Secondary | ICD-10-CM | POA: Diagnosis not present

## 2019-09-21 DIAGNOSIS — M79672 Pain in left foot: Secondary | ICD-10-CM

## 2019-09-21 NOTE — Patient Instructions (Signed)
Pre-Operative Instructions  Congratulations, you have decided to take an important step towards improving your quality of life.  You can be assured that the doctors and staff at [facility] will be with you every step of the way.  Here are some important things you should know:  1. Plan to be at the surgery center/hospital at least 1 (one) hour prior to your scheduled time, unless otherwise directed by the surgical center/hospital staff.  You must have a responsible adult accompany you, remain during the surgery and drive you home.  Make sure you have directions to the surgical center/hospital to ensure you arrive on time. 2. If you are having surgery at [facility] or [facility] hospitals, you will need a copy of your medical history and physical form from your family physician within one month prior to the date of surgery. We will give you a form for your primary physician to complete.  3. We make every effort to accommodate the date you request for surgery.  However, there are times where surgery dates or times have to be moved.  We will contact you as soon as possible if a change in schedule is required.   4. No aspirin/ibuprofen for one week before surgery.  If you are on aspirin, any non-steroidal anti-inflammatory medications (Mobic, Aleve, Ibuprofen) should not be taken seven (7) days prior to your surgery.  You make take Tylenol for pain prior to surgery.  5. Medications - If you are taking daily heart and blood pressure medications, seizure, reflux, allergy, asthma, anxiety, pain or diabetes medications, make sure you notify the surgery center/hospital before the day of surgery so they can tell you which medications you should take or avoid the day of surgery. 6. No food or drink after midnight the night before surgery unless directed otherwise by surgical center/hospital staff. 7. No alcoholic beverages 24-hours prior to surgery.  No smoking 24-hours prior or 24-hours after  surgery. 8. Wear loose pants or shorts. They should be loose enough to fit over bandages, boots, and casts. 9. Don't wear slip-on shoes. Sneakers are preferred. 10. Bring your boot with you to the surgery center/hospital.  Also bring crutches or a walker if your physician has prescribed it for you.  If you do not have this equipment, it will be provided for you after surgery. 11. If you have not been contacted by the surgery center/hospital by the day before your surgery, call to confirm the date and time of your surgery. 12. Leave-time from work may vary depending on the type of surgery you have.  Appropriate arrangements should be made prior to surgery with your employer. 13. Prescriptions will be provided immediately following surgery by your doctor.  Fill these as soon as possible after surgery and take the medication as directed. Pain medications will not be refilled on weekends and must be approved by the doctor. 14. Remove nail polish on the operative foot and avoid getting pedicures prior to surgery. 15. Wash the night before surgery.  The night before surgery wash the foot and leg well with water and the antibacterial soap provided. Be sure to pay special attention to beneath the toenails and in between the toes.  Wash for at least three (3) minutes. Rinse thoroughly with water and dry well with a towel.  Perform this wash unless told not to do so by your physician.  Enclosed: 1 Ice pack (please put in freezer the night before surgery)   1 Hibiclens skin cleaner     Pre-op instructions  If you have any questions regarding the instructions, please do not hesitate to call our office.  [place]: [address], [place], [place] [address] -- [phone]  [place]: [address] [place], [place] [address] -- [phone]  [place]: [address], [place], [place] [address] -- [phone]   Website: [email or web address] 

## 2019-09-24 ENCOUNTER — Telehealth: Payer: Self-pay | Admitting: *Deleted

## 2019-09-24 NOTE — Telephone Encounter (Signed)
I am returning your call.  Dr. Amalia Hailey' next available surgery date is November 15, 2019.  "Let me talk it over with my husband and I'll get back with you tomorrow.  Is that okay?  Will you be in the office tomorrow?"  Yes, you can call me back tomorrow.  I will be in the office.

## 2019-09-24 NOTE — Telephone Encounter (Signed)
"  I just need to check some surgery dates with you say around the first part of March.  If you would, give me a call back."

## 2019-09-26 NOTE — Telephone Encounter (Signed)
I spoke to you the other evening about scheduling my surgery. You had March 19 th available. I wanted to see if I could go ahead and schedule for that day or if you have anything sooner.

## 2019-09-27 NOTE — Telephone Encounter (Signed)
I am returning your call.  Did you decide to have your surgery on November 15, 2019.  "Is that the earliest date he has available?  If so, put me down for the 18th."  Yes, that is the next available date.  I'll get it scheduled.  Someone from the surgical center will give you a call a day or two prior to your surgery date and will give you your arrival time.  You need to go online and register via the surgical center's [facility] [employer] Portal, the instructions are in the brochure that we gave you.  "Okay, I'll take care of that."

## 2019-10-02 NOTE — Progress Notes (Signed)
HPI: 54 year old female presents the office today for follow-up evaluation regarding bunions to the bilateral feet.  She does have a history of a bunionectomy to her left foot back in 2006 in [place].  Patient had a poor result with shortening and recurrence of the bunion deformity.  She continues to have pain associated with this bunion.  She is here to discuss possible revisional surgery to the left foot and this was discussed in detail last visit on 07/21/2018.  Past Medical History:  Diagnosis Date  . Chicken pox   . Colon polyps 2015  . Cyst of left kidney   . Depression   . Dysphagia   . Endometriosis   . GERD (gastroesophageal reflux disease)   . History of gastritis 09/17/2015  . History of kidney stones   . History of stomach ulcers   . IBS (irritable bowel syndrome)   . Migraines      Physical Exam: General: The patient is alert and oriented x3 in no acute distress.  Dermatology: Skin is warm, dry and supple bilateral lower extremities. Negative for open lesions or macerations.  Vascular: Palpable pedal pulses bilaterally. No edema or erythema noted. Capillary refill within normal limits.  Neurological: Epicritic and protective threshold grossly intact bilaterally.   Musculoskeletal Exam: Range of motion within normal limits to all pedal and ankle joints bilateral. Muscle strength 5/5 in all groups bilateral.  Large medial eminence of the first metatarsal heads noted to the bilateral feet.  Radiographic Exam taken 07/21/2018:  Normal osseous mineralization. Joint spaces preserved. No fracture/dislocation/boney destruction.  In regards to the history of the bunionectomy to the left foot there is no orthopedic hardware noted.  There continues to be a significant increased intermetatarsal angle noted to the first intermetatarsal space greater than 15 degrees with abnormal alignment of the proximal articular set angle of the metatarsal head.  There is also some abnormal  appearance of the diaphysis of the proximal phalanx to the left hallux possibly consistent with [facility] osteotomy without any internal fixation.  Assessment: 1.  Hallux abductovalgus deformity bilateral 2.  History of bunionectomy left-2006   Plan of Care:  1. Patient evaluated. X-Rays reviewed.  2.  Left foot bunion: After evaluation today and review of the x-rays taken previously I do believe the patient would benefit most greatly from first MTPJ arthrodesis with bone allograft to restore the length of the first ray.  I explained in detail this procedure and she is in agreement that she would like to have surgery. 3.  Right foot bunion: Bunionectomy procedure to the right foot would consist of bunionectomy with first metatarsal osteotomy and possible Akin osteotomy.  I explained to the patient that the right foot bunionectomy procedure should have much greater success and more reproducible results given the fact that it is not a revisional surgery.  Patient understands.  4.  The patient would like to proceed with left foot surgery first.  All possible complications and details the procedure were explained.  No guarantees were expressed or implied. 5.  Authorization for surgery was initiated today.  Surgery will consist of first MTPJ arthrodesis with length restoring allograft left foot. 6.  Return to clinic 1 week postop  *Husband owns [employer].  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 Edrick Kins, DPM    [address]  [place], [place] [address]                Office [phone]  Fax [phone]

## 2019-10-05 ENCOUNTER — Ambulatory Visit: Payer: BC Managed Care – PPO | Admitting: Urology

## 2019-10-15 ENCOUNTER — Telehealth: Payer: Self-pay | Admitting: Podiatry

## 2019-10-15 NOTE — Telephone Encounter (Signed)
DOS: 11/15/2019  SURGICAL PROCEDURE: Hallux MPJ Fusion [id number]).  BCBS Policy Effective : 123XX123  -  08/29/2198  Member Liability Summary  In-Network   Max Per Benefit Period Year-to-Date Remaining     CoInsurance 20%       Deductible $2000.00  $1861.49     Out-Of-Pocket 3 $5000.00 $4592.49 3 Out-of-Pocket includes copay, deductible, and coinsurance.  [place] Required Not Applicable 123456  per  Service Year No

## 2019-10-25 DIAGNOSIS — R030 Elevated blood-pressure reading, without diagnosis of hypertension: Secondary | ICD-10-CM | POA: Diagnosis not present

## 2019-10-25 DIAGNOSIS — M542 Cervicalgia: Secondary | ICD-10-CM | POA: Diagnosis not present

## 2019-11-01 DIAGNOSIS — Z20822 Contact with and (suspected) exposure to covid-19: Secondary | ICD-10-CM | POA: Diagnosis not present

## 2019-11-01 DIAGNOSIS — R05 Cough: Secondary | ICD-10-CM | POA: Diagnosis not present

## 2019-11-01 DIAGNOSIS — J209 Acute bronchitis, unspecified: Secondary | ICD-10-CM | POA: Diagnosis not present

## 2019-11-02 DIAGNOSIS — I83812 Varicose veins of left lower extremities with pain: Secondary | ICD-10-CM | POA: Diagnosis not present

## 2019-11-08 DIAGNOSIS — I83892 Varicose veins of left lower extremities with other complications: Secondary | ICD-10-CM | POA: Diagnosis not present

## 2019-11-08 DIAGNOSIS — I872 Venous insufficiency (chronic) (peripheral): Secondary | ICD-10-CM | POA: Diagnosis not present

## 2019-11-08 DIAGNOSIS — I83812 Varicose veins of left lower extremities with pain: Secondary | ICD-10-CM | POA: Diagnosis not present

## 2019-11-23 ENCOUNTER — Encounter: Payer: BC Managed Care – PPO | Admitting: Podiatry

## 2019-11-28 ENCOUNTER — Telehealth: Payer: Self-pay | Admitting: Family Medicine

## 2019-11-28 ENCOUNTER — Other Ambulatory Visit: Payer: Self-pay | Admitting: Family Medicine

## 2019-11-28 DIAGNOSIS — F909 Attention-deficit hyperactivity disorder, unspecified type: Secondary | ICD-10-CM

## 2019-11-28 MED ORDER — AMPHETAMINE-DEXTROAMPHETAMINE 12.5 MG PO TABS: 12.5000 mg | ORAL_TABLET | Freq: Two times a day (BID) | ORAL | 0 refills | Status: DC

## 2019-11-28 NOTE — Telephone Encounter (Signed)
Pt called and schedule an appt for 01/04/20

## 2019-11-28 NOTE — Telephone Encounter (Signed)
Message sent to PCP already. Closing due to duplicate

## 2019-11-28 NOTE — Telephone Encounter (Signed)
Please get her scheduled for follow-up and then I will consider refilling. She needs to follow-up every 3 months for this type of medication.

## 2019-11-28 NOTE — Telephone Encounter (Signed)
Refill request for Adderall, last seen 08-29-19, last filled 09-07-19.  Please advise.

## 2019-11-28 NOTE — Telephone Encounter (Signed)
Mychart sent to inform patient.

## 2019-11-28 NOTE — Telephone Encounter (Signed)
Sent to pharmacy 

## 2019-11-28 NOTE — Telephone Encounter (Signed)
Patient has scheduled appointment for Jan 04, 2020. Please advise for refill. This was the soonest that was available for this type of office visit.

## 2019-11-28 NOTE — Telephone Encounter (Signed)
Needs an appointment every 3 months to have this refilled. Please get her scheduled and then I will consider refilling.

## 2019-11-28 NOTE — Telephone Encounter (Signed)
Pt called in an need prescription [facility]ill [facility]. She has about 4 pills left.

## 2019-11-28 NOTE — Telephone Encounter (Signed)
Duplicate message. Closing note

## 2019-12-04 ENCOUNTER — Encounter: Payer: BC Managed Care – PPO | Admitting: Podiatry

## 2019-12-06 ENCOUNTER — Encounter: Payer: Self-pay | Admitting: Urology

## 2019-12-06 ENCOUNTER — Other Ambulatory Visit: Payer: Self-pay

## 2019-12-06 ENCOUNTER — Ambulatory Visit: Payer: BC Managed Care – PPO | Admitting: Urology

## 2019-12-06 VITALS — BP 114/73 | HR 103 | Ht 66.0 in | Wt 126.0 lb

## 2019-12-06 DIAGNOSIS — N3281 Overactive bladder: Secondary | ICD-10-CM | POA: Diagnosis not present

## 2019-12-06 DIAGNOSIS — R3129 Other microscopic hematuria: Secondary | ICD-10-CM | POA: Diagnosis not present

## 2019-12-06 LAB — MICROSCOPIC EXAMINATION: RBC, Urine: NONE SEEN /hpf (ref 0–2)

## 2019-12-06 LAB — URINALYSIS, COMPLETE
Bilirubin, UA: NEGATIVE
Glucose, UA: NEGATIVE
Ketones, UA: NEGATIVE
Leukocytes,UA: NEGATIVE
Nitrite, UA: NEGATIVE
Protein,UA: NEGATIVE
RBC, UA: NEGATIVE
Specific Gravity, UA: >1.03 — ABNORMAL HIGH (ref 1.005–1.030)
Urobilinogen, Ur: 0.2 mg/dL (ref 0.2–1.0)
pH, UA: 5.5 (ref 5.0–7.5)

## 2019-12-06 MED ORDER — GEMTESA 75 MG PO TABS: 1.0000 | ORAL_TABLET | Freq: Every day | ORAL | 0 refills | Status: DC

## 2019-12-06 NOTE — Progress Notes (Signed)
12/06/2019 1:00 PM   Suzanne Cruz 1966-01-16 [id number]  Referring provider: Leone Haven, MD [address] [place],  [place] [address]  Chief Complaint  Patient presents with  . Hematuria    HPI: Patient is a 54 -year-old female with microscopic hematuria who presents today for follow up.   History of hematuria (intermediate risk) Non-smoker.  Contrast CT 09/15/2018 normal adrenal glands.  Bilateral renal enhancement and contrast excretion is symmetric and normal. Mild chronic left posterior upper pole cortical scarring is noted. Diminutive and unremarkable urinary bladder. Stable pelvic phleboliths.  RUS 02/2019 NED.  Cystoscopy with Dr. Bernardo Heater 02/2019 NED.  She does not report any gross hematuria.  Her UA today is negative for micro heme.    She has been experiencing frequency and urgency for approximately one month.  Patient denies any modifying or aggravating factors.  Patient denies any gross hematuria, dysuria or suprapubic/flank pain.  Patient denies any fevers, chills, nausea or vomiting.      PMH: Past Medical History:  Diagnosis Date  . Chicken pox   . Colon polyps 2015  . Cyst of left kidney   . Depression   . Dysphagia   . Endometriosis   . GERD (gastroesophageal reflux disease)   . History of gastritis 09/17/2015  . History of kidney stones   . History of stomach ulcers   . IBS (irritable bowel syndrome)   . Migraines     Surgical History: Past Surgical History:  Procedure Laterality Date  . ABDOMINAL HYSTERECTOMY  2001   partial only right ovary is the only thing she as.  Marland Kitchen BREAST SURGERY Left 2005   biopsy  . BUNIONECTOMY Left 2004  . COLONOSCOPY WITH PROPOFOL N/A 03/17/2018   Procedure: COLONOSCOPY WITH PROPOFOL;  Surgeon: [name], MD;  Location: [facility] ENDOSCOPY;  Service: Endoscopy;  Laterality: N/A;  . ESOPHAGOGASTRODUODENOSCOPY (EGD) WITH PROPOFOL N/A 02/10/2015   Procedure: ESOPHAGOGASTRODUODENOSCOPY (EGD) WITH  PROPOFOL;  Surgeon: [name], MD;  Location: [facility] ENDOSCOPY;  Service: Endoscopy;  Laterality: N/A;  . ESOPHAGOGASTRODUODENOSCOPY (EGD) WITH PROPOFOL N/A 03/17/2018   Procedure: ESOPHAGOGASTRODUODENOSCOPY (EGD) WITH PROPOFOL;  Surgeon: [name], MD;  Location: [facility] ENDOSCOPY;  Service: Endoscopy;  Laterality: N/A;  . LITHOTRIPSY  1993    Home Medications:  Allergies as of 12/06/2019      Reactions   Sulfa Antibiotics Rash, Hives      Medication List       Accurate as of December 06, 2019 11:59 PM. If you have any questions, ask your nurse or doctor.        STOP taking these medications   cetirizine 10 MG tablet Commonly known as: ZYRTEC Stopped by: Melizza Kanode, PA-C   gabapentin 100 MG capsule Commonly known as: NEURONTIN Stopped by: Zara Council, PA-C   ProAir HFA 108 (90 Base) MCG/ACT inhaler Generic drug: albuterol Stopped by: Zara Council, PA-C     TAKE these medications   amphetamine-dextroamphetamine 12.5 MG tablet Commonly known as: ADDERALL Take 1 tablet by mouth 2 (two) times daily.   cyclobenzaprine 5 MG tablet Commonly known as: FLEXERIL Take 1 tablet (5 mg total) by mouth at bedtime as needed for muscle spasms.   diclofenac 75 MG EC tablet Commonly known as: VOLTAREN diclofenac sodium 75 mg tablet,delayed release  TAKE 1 TABLET BY MOUTH TWICE A DAY   FLUoxetine 20 MG tablet Commonly known as: PROZAC Take 1 tablet (20 mg total) by mouth daily.   Gemtesa 75  MG Tabs Generic drug: Vibegron Take 1 tablet by mouth daily. Started by: Zara Council, PA-C   meloxicam 15 MG tablet Commonly known as: MOBIC Take by mouth.   omeprazole 40 MG capsule Commonly known as: PRILOSEC Take 1 capsule (40 mg total) by mouth daily.   Restasis 0.05 % ophthalmic emulsion Generic drug: cycloSPORINE Restasis 0.05 % eye drops in a dropperette       Allergies:  Allergies  Allergen Reactions  . Sulfa Antibiotics Rash and Hives    Family  History: Family History  Problem Relation Age of Onset  . Sudden death Brother   . Hypertension Mother   . Colon polyps Mother   . Hypertension Brother   . Stroke Paternal Grandmother     Social History:  reports that she has never smoked. She has never used smokeless tobacco. She reports current alcohol use. She reports that she does not use drugs.  ROS: For pertinent review of systems please refer to history of present illness  Physical Exam: BP 114/73   Pulse (!) 103   Ht 5\' 6"  (1.676 m)   Wt 126 lb (57.2 kg)   BMI 20.34 kg/m   Constitutional:  Well nourished. Alert and oriented, No acute distress. HEENT: [place] AT, mask in place.  Trachea midline. Cardiovascular: No clubbing, cyanosis, or edema. Respiratory: Normal respiratory effort, no increased work of breathing. Neurologic: Grossly intact, no focal deficits, moving all 4 extremities. Psychiatric: Normal mood and affect.   Laboratory Data: Lab Results  Component Value Date   WBC 5.3 01/11/2019   HGB 14.5 01/11/2019   HCT 42.7 01/11/2019   MCV 86.5 01/11/2019   PLT 233.0 01/11/2019    Lab Results  Component Value Date   CREATININE 0.92 04/24/2019    Lab Results  Component Value Date   HGBA1C 5.8 04/24/2019    Lab Results  Component Value Date   TSH 0.78 05/16/2018       Component Value Date/Time   CHOL 227 (H) 04/24/2019 1553   HDL 69.40 04/24/2019 1553   CHOLHDL 3 04/24/2019 1553   VLDL 22.8 04/24/2019 1553   LDLCALC 135 (H) 04/24/2019 1553    Lab Results  Component Value Date   AST 16 04/24/2019   Lab Results  Component Value Date   ALT 12 04/24/2019    Urinalysis Component     Latest Ref Rng & Units 12/06/2019  Specific Gravity, UA     1.005 - 1.030 >1.030 (H)  pH, UA     5.0 - 7.5 5.5  Color, UA     Yellow Yellow  Appearance Ur     Clear Clear  Leukocytes,UA     Negative Negative  Protein,UA     Negative/Trace Negative  Glucose, UA     Negative Negative  Ketones, UA      Negative Negative  RBC, UA     Negative Negative  Bilirubin, UA     Negative Negative  Urobilinogen, Ur     0.2 - 1.0 mg/dL 0.2  Nitrite, UA     Negative Negative  Microscopic Examination      See below:   Component     Latest Ref Rng & Units 12/06/2019  WBC, UA     0 - 5 /hpf 0-5  RBC     0 - 2 /hpf None seen  Epithelial Cells (non renal)     0 - 10 /hpf 0-10  Casts     None seen /lpf Present (A)  Cast Type     N/A Hyaline casts  Bacteria, UA     None seen/Few Few   I have reviewed the labs.   Pertinent Imaging: No recent imaging  Assessment & Plan:   1. Microscopic hematuria (intermediate risk) Hematuria work up in 02/2019 NED No reports of gross hematuria Today's UA no micro heme RTC in one year for UA - to report any gross hematuria in the interim   2. OAB Will have a trial of the medication Gemtesa 75 mg daily RTC in 3 weeks for OAB questionnaire and PVR   Return in about 3 weeks (around 12/27/2019) for PVR and OAB questionnaire.  These notes generated with voice recognition software. I apologize for typographical errors.  Zara Council, PA-C  [facility] [address]  [place], [place] [address] [phone]

## 2019-12-07 DIAGNOSIS — N951 Menopausal and female climacteric states: Secondary | ICD-10-CM | POA: Diagnosis not present

## 2019-12-07 DIAGNOSIS — N390 Urinary tract infection, site not specified: Secondary | ICD-10-CM | POA: Diagnosis not present

## 2019-12-07 DIAGNOSIS — R102 Pelvic and perineal pain: Secondary | ICD-10-CM | POA: Diagnosis not present

## 2019-12-18 ENCOUNTER — Encounter: Payer: BC Managed Care – PPO | Admitting: Podiatry

## 2019-12-27 ENCOUNTER — Ambulatory Visit: Payer: Self-pay | Admitting: Urology

## 2019-12-28 ENCOUNTER — Other Ambulatory Visit: Payer: Self-pay

## 2019-12-28 ENCOUNTER — Encounter: Payer: Self-pay | Admitting: Nurse Practitioner

## 2019-12-28 ENCOUNTER — Ambulatory Visit: Payer: BC Managed Care – PPO | Admitting: Nurse Practitioner

## 2019-12-28 VITALS — BP 104/70 | HR 86 | Temp 97.5°F | Ht 66.0 in | Wt 129.4 lb

## 2019-12-28 DIAGNOSIS — K219 Gastro-esophageal reflux disease without esophagitis: Secondary | ICD-10-CM

## 2019-12-28 DIAGNOSIS — R1012 Left upper quadrant pain: Secondary | ICD-10-CM | POA: Diagnosis not present

## 2019-12-28 DIAGNOSIS — G8929 Other chronic pain: Secondary | ICD-10-CM | POA: Diagnosis not present

## 2019-12-28 DIAGNOSIS — M5442 Lumbago with sciatica, left side: Secondary | ICD-10-CM | POA: Diagnosis not present

## 2019-12-28 DIAGNOSIS — M5441 Lumbago with sciatica, right side: Secondary | ICD-10-CM | POA: Diagnosis not present

## 2019-12-28 LAB — URINALYSIS, ROUTINE W REFLEX MICROSCOPIC
Bilirubin Urine: NEGATIVE
Hgb urine dipstick: NEGATIVE
Ketones, ur: NEGATIVE
Leukocytes,Ua: NEGATIVE
Nitrite: NEGATIVE
RBC / HPF: NONE SEEN (ref 0–?)
Specific Gravity, Urine: 1.025 (ref 1.000–1.030)
Total Protein, Urine: NEGATIVE
Urine Glucose: NEGATIVE
Urobilinogen, UA: 0.2 (ref 0.0–1.0)
WBC, UA: NONE SEEN (ref 0–?)
pH: 6.5 (ref 5.0–8.0)
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
## 2019-12-28 NOTE — Patient Instructions (Addendum)
I have ordered laboratory studies for you today.  One of the studies is to check for stomach bacteria called H. Pylori which can cause stomach ulcers.  Cut back on your diclofenac and your meloxicam.  Take Tylenol as needed for intermittent low back discomfort.  Begin taking your Prilosec and 30 minutes before breakfast and 30 minutes before supper. Restart your Carafate as directed by your gastroenterologist.  Follow-up with your orthopedic doctor for your intermittent low back pain.    He did not have significant tenderness, normal range of motion and normal reflexes today.    You are on a new diet -and it may not be agreeing with your stomach.  Cut back on salads and hard to digest foods right now.  Eat more soft, bland diet options such as scrambled eggs, potatoes, sweet potato, bananas, rice,  Applesauce, toast, grilled chicken, pasta, as these may not upset your stomach as much as salads and greens.    Follow-up with Dr. Caryl Bis next week.  Pending laboratory studies, if the above does not help, then I would recommend a CT of the abdomen and pelvis. Abdominal Pain, Adult Pain in the abdomen (abdominal pain) can be caused by many things. Often, abdominal pain is not serious and it gets better with no treatment or by being treated at home. However, sometimes abdominal pain is serious. Your health care provider will ask questions about your medical history and do a physical exam to try to determine the cause of your abdominal pain. Follow these instructions at home:  Medicines  Take over-the-counter and prescription medicines only as told by your health care provider.  Do not take a laxative unless told by your health care provider. General instructions  Watch your condition for any changes.  Drink enough fluid to keep your urine pale yellow.  Keep all follow-up visits as told by your health care provider. This is important. Contact a health care provider if:  Your abdominal  pain changes or gets worse.  You are not hungry or you lose weight without trying.  You are constipated or have diarrhea for more than 2-3 days.  You have pain when you urinate or have a bowel movement.  Your abdominal pain wakes you up at night.  Your pain gets worse with meals, after eating, or with certain foods.  You are vomiting and cannot keep anything down.  You have a fever.  You have blood in your urine. Get help right away if:  Your pain does not go away as soon as your health care provider told you to expect.  You cannot stop vomiting.  Your pain is only in areas of the abdomen, such as the right side or the left lower portion of the abdomen. Pain on the right side could be caused by appendicitis.  You have bloody or black stools, or stools that look like tar.  You have severe pain, cramping, or bloating in your abdomen.  You have signs of dehydration, such as: ? Dark urine, very little urine, or no urine. ? Cracked lips. ? Dry mouth. ? Sunken eyes. ? Sleepiness. ? Weakness.  You have trouble breathing or chest pain. Summary  Often, abdominal pain is not serious and it gets better with no treatment or by being treated at home. However, sometimes abdominal pain is serious.  Watch your condition for any changes.  Take over-the-counter and prescription medicines only as told by your health care provider.  Contact a health care provider if your abdominal  pain changes or gets worse.  Get help right away if you have severe pain, cramping, or bloating in your abdomen. This information is not intended to replace advice given to you by your health care provider. Make sure you discuss any questions you have with your health care provider. Document Revised: 12/25/2018 Document Reviewed: 12/25/2018 Elsevier Patient Education  [place].  Lumbosacral Strain Lumbosacral strain is an injury that causes pain in the lower back (lumbosacral spine). This injury  usually happens from overstretching the muscles or ligaments along your spine. Ligaments are cord-like tissues that connect bones to other bones. A strain can affect one or more muscles or ligaments. What are the causes? This condition may be caused by:  A hard, direct hit to the back.  Overstretching the lower back muscles. This may result from: ? A fall. ? Lifting something heavy. ? Repetitive movements such as bending or crouching. What increases the risk? The following factors may make you more likely to develop this condition:  Participating in sports or activities that involve: ? A sudden twist of the back. ? Pushing or pulling motions.  Being overweight or obese.  Having poor strength and flexibility, especially tight hamstrings or weak muscles in the back or abdomen.  Having too much of a curve in the lower back.  Having a pelvis that is tilted forward. What are the signs or symptoms? The main symptom of this condition is pain in the lower back, at the site of the strain. Pain may also be felt down one or both legs. How is this diagnosed? This condition is diagnosed based on your symptoms, your medical history, and a physical exam. During the physical exam, your health care provider may push on certain areas of your back to find the source of your pain. You may be asked to bend forward, backward, and side to side to check your pain and range of motion. You may also have imaging tests, such as X-rays and an MRI. How is this treated? This condition may be treated by:  Applying heat and cold on the affected area.  Taking medicines to help relieve pain and relax your muscles.  Taking NSAIDs, such as ibuprofen, to help reduce swelling and discomfort.  Doing stretching and strengthening exercises for your lower back. Symptoms usually improve within several weeks of treatment. However, recovery time varies. When your symptoms improve, gradually return to your normal routine as  soon as possible to reduce pain, avoid stiffness, and keep muscle strength. Follow these instructions at home: Medicines  Take over-the-counter and prescription medicines only as told by your health care provider.  Ask your health care provider if the medicine prescribed to you: ? Requires you to avoid driving or using heavy machinery. ? Can cause constipation. You may need to take these actions to prevent or treat constipation:  Drink enough fluid to keep your urine pale yellow.  Take over-the-counter or prescription medicines.  Eat foods that are high in fiber, such as beans, whole grains, and fresh fruits and vegetables.  Limit foods that are high in fat and processed sugars, such as fried or sweet foods. Managing pain, stiffness, and swelling      If directed, put ice on the injured area. To do this: ? Put ice in a plastic bag. ? Place a towel between your skin and the bag. ? Leave the ice on for 20 minutes, 2-3 times a day.  If directed, apply heat on the affected area as often  as told by your health care provider. Use the heat source that your health care provider recommends, such as a moist heat pack or a heating pad. ? Place a towel between your skin and the heat source. ? Leave the heat on for 20-30 minutes. ? Remove the heat if your skin turns bright red. This is especially important if you are unable to feel pain, heat, or cold. You may have a greater risk of getting burned. Activity  Rest as told by your health care provider.  Do not stay in bed. Staying in bed for more than 1-2 days can delay your recovery.  Return to your normal activities as told by your health care provider. Ask your health care provider what activities are safe for you.  Avoid activities that take a lot of energy for as long as told by your health care provider.  Do exercises as told by your health care provider. This includes stretching and strengthening exercises. General  instructions  Sit up and stand up straight. Avoid leaning forward when you sit, or hunching over when you stand.  Do not use any products that contain nicotine or tobacco, such as cigarettes, e-cigarettes, and chewing tobacco. If you need help quitting, ask your health care provider.  Keep all follow-up visits as told by your health care provider. This is important. How is this prevented?   Use correct form when playing sports and lifting heavy objects.  Use good posture when sitting and standing.  Maintain a healthy weight.  Sleep on a mattress with medium firmness to support your back.  Do at least 150 minutes of moderate-intensity exercise each week, such as brisk walking or water aerobics. Try a form of exercise that takes stress off your back, such as swimming or stationary cycling.  Maintain physical fitness, including: ? Strength. ? Flexibility. Contact a health care provider if:  Your back pain does not improve after several weeks of treatment.  Your symptoms get worse. Get help right away if:  Your back pain is severe.  You cannot stand or walk.  You have difficulty controlling when you urinate or when you have a bowel movement.  You feel nauseous or you vomit.  Your feet or legs get very cold, turn pale, or look blue.  You have numbness, tingling, weakness, or problems using your arms or legs.  You develop any of the following: ? Shortness of breath. ? Dizziness. ? Pain in your legs. ? Weakness in your buttocks or legs. Summary  Lumbosacral strain is an injury that causes pain in the lower back (lumbosacral spine).  This injury usually happens from overstretching the muscles or ligaments along your spine.  This condition may be caused by a direct hit to the lower back or by overstretching the lower back muscles.  Symptoms usually improve within several weeks of treatment. This information is not intended to replace advice given to you by your health  care provider. Make sure you discuss any questions you have with your health care provider. Document Revised: 01/09/2019 Document Reviewed: 01/09/2019 Elsevier Patient Education  [place].

## 2019-12-29 DIAGNOSIS — R1012 Left upper quadrant pain: Secondary | ICD-10-CM | POA: Insufficient documentation

## 2019-12-29 DIAGNOSIS — G8929 Other chronic pain: Secondary | ICD-10-CM | POA: Insufficient documentation

## 2019-12-29 DIAGNOSIS — M5442 Lumbago with sciatica, left side: Secondary | ICD-10-CM | POA: Insufficient documentation

## 2019-12-29 LAB — CBC WITH DIFFERENTIAL/PLATELET
Absolute Monocytes: 299 cells/uL (ref 200–950)
Basophils Absolute: 40 cells/uL (ref 0–200)
Basophils Relative: 0.9 %
Eosinophils Absolute: 88 cells/uL (ref 15–500)
Eosinophils Relative: 2 %
HCT: 40.5 % (ref 35.0–45.0)
Hemoglobin: 13.6 g/dL (ref 11.7–15.5)
Lymphs Abs: 1505 cells/uL (ref 850–3900)
MCH: 29.1 pg (ref 27.0–33.0)
MCHC: 33.6 g/dL (ref 32.0–36.0)
MCV: 86.5 fL (ref 80.0–100.0)
MPV: 11.5 fL (ref 7.5–12.5)
Monocytes Relative: 6.8 %
Neutro Abs: 2468 cells/uL (ref 1500–7800)
Neutrophils Relative %: 56.1 %
Platelets: 211 10*3/uL (ref 140–400)
RBC: 4.68 10*6/uL (ref 3.80–5.10)
RDW: 12.1 % (ref 11.0–15.0)
Total Lymphocyte: 34.2 %
WBC: 4.4 10*3/uL (ref 3.8–10.8)

## 2019-12-29 LAB — COMPREHENSIVE METABOLIC PANEL
AG Ratio: 1.7 (calc) (ref 1.0–2.5)
ALT: 15 U/L (ref 6–29)
AST: 16 U/L (ref 10–35)
Albumin: 4.2 g/dL (ref 3.6–5.1)
Alkaline phosphatase (APISO): 82 U/L (ref 37–153)
BUN: 20 mg/dL (ref 7–25)
CO2: 27 mmol/L (ref 20–32)
Calcium: 9.6 mg/dL (ref 8.6–10.4)
Chloride: 108 mmol/L (ref 98–110)
Creat: 0.98 mg/dL (ref 0.50–1.05)
Globulin: 2.5 g/dL (calc) (ref 1.9–3.7)
Glucose, Bld: 93 mg/dL (ref 65–99)
Potassium: 4.8 mmol/L (ref 3.5–5.3)
Sodium: 142 mmol/L (ref 135–146)
Total Bilirubin: 0.4 mg/dL (ref 0.2–1.2)
Total Protein: 6.7 g/dL (ref 6.1–8.1)

## 2019-12-29 LAB — LIPASE: Lipase: 72 U/L — ABNORMAL HIGH (ref 7–60)

## 2019-12-29 NOTE — Assessment & Plan Note (Signed)
Her back exam showed tenderness. But ROM excellent and it ws not bothering her much at the moment.   She was advised to cut back on her diclofenac and meloxicam as they may be irritating her stomach. Take Tylenol as needed for intermittent low back discomfort. Follow-up with your orthopedic doctor.

## 2019-12-29 NOTE — Assessment & Plan Note (Signed)
3 week history of  LUQ post prandial pain following new diet called Octavia.  She takes NSAIDs daily.  She has been off of her PPI and Carafate for over a year. The differential diagnosis includes: gastritis, peptic ulcer, H. pylori, less likely pancreatitis, cholecystitis, and no lower abdominal pain or tenderness to suspect diverticulitis.  Advised to cut back on salads and hard to digest foods right now.  Eat more soft, bland diet options such as scrambled eggs, potatoes, sweet potato, bananas, rice, applesauce, toast, grilled chicken, pasta, for a few days.     Recent upper GI barium swallow performed last year showed sliding small sliding-type hernia, no reflux, normal stomach and duodenum.  She remains  hesitant to take PPIs and sucralfate due to long-term adverse effects.Patient advised:  Begin taking your Prilosec and 30 minutes before breakfast and 30 minutes before supper. Restart your Carafate as directed by your gastroenterologist.  She was advised to cut back on her diclofenac and meloxicam. Advised to cut back on salads and hard to digest foods right now.  Eat more soft, bland diet options such as scrambled eggs, potatoes, sweet potato, bananas, rice, applesauce, toast, grilled chicken, pasta, for a few days.     Recent upper GI barium swallow performed 2020 showed sliding small sliding-type hernia, no reflux, normal stomach and duodenum.  She remains  hesitant to take PPIs and sucralfate due to long-term adverse effects.Patient advised:  Begin taking your Prilosec and 30 minutes before breakfast and 30 minutes before supper. Restart your Carafate as directed by your gastroenterologist.  She was advised to cut back on her diclofenac and meloxicam.

## 2019-12-31 ENCOUNTER — Encounter: Payer: Self-pay | Admitting: Family Medicine

## 2019-12-31 DIAGNOSIS — R1013 Epigastric pain: Secondary | ICD-10-CM

## 2019-12-31 LAB — H. PYLORI BREATH TEST: H. pylori Breath Test: NOT DETECTED

## 2020-01-01 ENCOUNTER — Ambulatory Visit: Payer: Self-pay | Admitting: Urology

## 2020-01-01 ENCOUNTER — Other Ambulatory Visit (INDEPENDENT_AMBULATORY_CARE_PROVIDER_SITE_OTHER): Payer: BC Managed Care – PPO

## 2020-01-01 ENCOUNTER — Other Ambulatory Visit: Payer: Self-pay

## 2020-01-01 DIAGNOSIS — R1013 Epigastric pain: Secondary | ICD-10-CM | POA: Diagnosis not present

## 2020-01-01 NOTE — Telephone Encounter (Signed)
Left patient a VM to call back to schedule lab appt. Also sent message to patient thru mychart.

## 2020-01-01 NOTE — Telephone Encounter (Signed)
She had an aching pain and gnawing pain again in center when food hits it  and into her back  It hurt worse on left side. She started started taking omeprazole bid and Carafate ac bid on Friday.She took a H pylori breath test on Fri and that really hurt her stomach afterward.   She had not been taking a PPI except on rare intervals. She does take Mobic x 2 per week. She endorses upper abdomen pain a few min after food hits her stomach and did well until she ate dinner last night and ate rice and noodles, zucchini, mushroom. She has constant burps and heartburn, mild nausea, no vomiting. No exertional component or CP/SOB.   This pain is brand new and it has been going on  for sevreal months. It sometimes radiates into the back. She ate toast today and it did not hurt as bad.   She is concerned about the Lipase and was given reassurance. She has an intact gallbladder.   Plan; Check lipase today. Increase Carafate tid ac. PPi BID. Bland soft no oiled fried foods. Conisder CT if lipase climbe. Gi referral

## 2020-01-02 ENCOUNTER — Telehealth: Payer: Self-pay | Admitting: Nurse Practitioner

## 2020-01-02 DIAGNOSIS — R1012 Left upper quadrant pain: Secondary | ICD-10-CM

## 2020-01-02 LAB — LIPASE: Lipase: 78 U/L — ABNORMAL HIGH (ref 11.0–59.0)

## 2020-01-02 MED ORDER — SUCRALFATE 1 GM/10ML PO SUSP: 1.0000 g | Freq: Three times a day (TID) | ORAL | 0 refills | Status: AC

## 2020-01-02 NOTE — Addendum Note (Signed)
Addended by: Denice Paradise A on: 01/02/2020 03:10 PM   Modules accepted: Orders

## 2020-01-02 NOTE — Telephone Encounter (Signed)
I spoke with Paisly about her stomach pain after eating. I will order Abd [place] and she is to remain on treatment for

## 2020-01-02 NOTE — Telephone Encounter (Signed)
Ok. Sent. Thank you!

## 2020-01-02 NOTE — Telephone Encounter (Signed)
The patient is not feeling better on the PPI BI or Carafate ac TID. She has pain after eating in left side of the  abdomen.   I am ordering a CT abd/pelvis for left abdominal pain and slightly elevated lipase. She needs referral back to GI to follow. Take your stomach medications. Hydrate well today. Eat low fat and lightly.

## 2020-01-04 ENCOUNTER — Encounter: Payer: Self-pay | Admitting: Family Medicine

## 2020-01-04 ENCOUNTER — Ambulatory Visit: Payer: BC Managed Care – PPO | Admitting: Family Medicine

## 2020-01-04 ENCOUNTER — Ambulatory Visit: Payer: Self-pay | Admitting: Urology

## 2020-01-04 ENCOUNTER — Other Ambulatory Visit: Payer: Self-pay

## 2020-01-04 VITALS — BP 118/80 | HR 82 | Temp 98.3°F | Ht 66.0 in | Wt 131.4 lb

## 2020-01-04 DIAGNOSIS — F909 Attention-deficit hyperactivity disorder, unspecified type: Secondary | ICD-10-CM | POA: Diagnosis not present

## 2020-01-04 DIAGNOSIS — R1012 Left upper quadrant pain: Secondary | ICD-10-CM | POA: Diagnosis not present

## 2020-01-04 LAB — LIPASE: Lipase: 65 U/L — ABNORMAL HIGH (ref 11.0–59.0)

## 2020-01-04 MED ORDER — AMPHETAMINE-DEXTROAMPHETAMINE 12.5 MG PO TABS: 12.5000 mg | ORAL_TABLET | Freq: Two times a day (BID) | ORAL | 0 refills | Status: DC

## 2020-01-04 NOTE — Assessment & Plan Note (Signed)
Ongoing issues with this though has improved.  She will continue with her omeprazole and Carafate.  We will recheck a lipase today.  She will complete the ultrasound as scheduled.  She will see GI as planned.

## 2020-01-04 NOTE — Progress Notes (Signed)
Tommi Rumps, MD Phone: [phone]  Suzanne Cruz is a 54 y.o. female who presents today for f/u.  ADHD Medication: adderall Effectiveness: yes Palpitations: no Sleep difficulty: occasional though she typically does not take the afternoon dose and has no issues when she skips that Appetite suppression: mild, does not effect how much she eats  Left upper quadrant pain: Patient has cut down on her diclofenac and meloxicam.  She was previously alternating those.  She discontinued the [employer].  She started back on omeprazole and Carafate.  She notes this has been improving.  Typically hurts when she eats and feels as though there is a fullness there.  Laying on that side causes aching.  She has an appointment with GI next month.  Her lipase has been minimally elevated and a CT was recommended though her insurance would not cover this.  Ultrasound has been ordered and is scheduled for next week.   Social History   Tobacco Use  Smoking Status Never Smoker  Smokeless Tobacco Never Used     ROS see history of present illness  Objective  Physical Exam Vitals:   01/04/20 0950  BP: 118/80  Pulse: 82  Temp: 98.3 F (36.8 C)  SpO2: 97%    BP Readings from Last 3 Encounters:  01/04/20 118/80  12/28/19 104/70  12/06/19 114/73   Wt Readings from Last 3 Encounters:  01/04/20 131 lb 6.4 oz (59.6 kg)  12/28/19 129 lb 6.4 oz (58.7 kg)  12/06/19 126 lb (57.2 kg)    Physical Exam Constitutional:      General: She is not in acute distress.    Appearance: She is not diaphoretic.  Cardiovascular:     Rate and Rhythm: Normal rate and regular rhythm.     Heart sounds: Normal heart sounds.  Pulmonary:     Effort: Pulmonary effort is normal.     Breath sounds: Normal breath sounds.  Abdominal:     General: Bowel sounds are normal. There is no distension.     Palpations: Abdomen is soft.     Tenderness: There is abdominal tenderness (Mild left upper quadrant). There is no  guarding or rebound.  Musculoskeletal:     Right lower leg: No edema.     Left lower leg: No edema.  Skin:    General: Skin is warm and dry.  Neurological:     Mental Status: She is alert.      Assessment/Plan: Please see individual problem list.  Left upper quadrant pain Ongoing issues with this though has improved.  She will continue with her omeprazole and Carafate.  We will recheck a lipase today.  She will complete the ultrasound as scheduled.  She will see GI as planned.  Adult ADHD Stable.  She will continue Adderall.  She can take this once a day as it does last her long enough for her workday.  If she needs longer duration of action we could try switching her to extended release version of Adderall.  Refill given.   Orders Placed This Encounter  Procedures  . Lipase    Meds ordered this encounter  Medications  . amphetamine-dextroamphetamine (ADDERALL) 12.5 MG tablet    Sig: Take 1 tablet by mouth 2 (two) times daily.    Dispense:  60 tablet    Refill:  0    This visit occurred during the SARS-CoV-2 public health emergency.  Safety protocols were in place, including screening questions prior to the visit, additional usage of staff PPE, and  extensive cleaning of exam room while observing appropriate contact time as indicated for disinfecting solutions.    Tommi Rumps, MD [place]

## 2020-01-04 NOTE — Assessment & Plan Note (Signed)
Stable.  She will continue Adderall.  She can take this once a day as it does last her long enough for her workday.  If she needs longer duration of action we could try switching her to extended release version of Adderall.  Refill given.

## 2020-01-04 NOTE — Patient Instructions (Signed)
Nice to see you. Please complete the ultrasound.  Please see GI as planned. We will get a lipase today. I refilled her Adderall.

## 2020-01-11 ENCOUNTER — Ambulatory Visit
Admission: RE | Admit: 2020-01-11 | Discharge: 2020-01-11 | Disposition: A | Discharge status: Home / Self Care | Payer: BC Managed Care – PPO | Source: Ambulatory Visit | Attending: Nurse Practitioner | Admitting: Nurse Practitioner

## 2020-01-11 ENCOUNTER — Other Ambulatory Visit: Payer: Self-pay

## 2020-01-11 DIAGNOSIS — R1012 Left upper quadrant pain: Secondary | ICD-10-CM | POA: Insufficient documentation

## 2020-02-22 DIAGNOSIS — R1013 Epigastric pain: Secondary | ICD-10-CM | POA: Diagnosis not present

## 2020-02-22 DIAGNOSIS — K2950 Unspecified chronic gastritis without bleeding: Secondary | ICD-10-CM | POA: Diagnosis not present

## 2020-02-22 DIAGNOSIS — R1012 Left upper quadrant pain: Secondary | ICD-10-CM | POA: Diagnosis not present

## 2020-02-22 DIAGNOSIS — K219 Gastro-esophageal reflux disease without esophagitis: Secondary | ICD-10-CM | POA: Diagnosis not present

## 2020-03-20 DIAGNOSIS — I83892 Varicose veins of left lower extremities with other complications: Secondary | ICD-10-CM | POA: Diagnosis not present

## 2020-03-20 DIAGNOSIS — I872 Venous insufficiency (chronic) (peripheral): Secondary | ICD-10-CM | POA: Diagnosis not present

## 2020-03-20 DIAGNOSIS — I83812 Varicose veins of left lower extremities with pain: Secondary | ICD-10-CM | POA: Diagnosis not present

## 2020-04-04 ENCOUNTER — Encounter: Payer: Self-pay | Admitting: Family Medicine

## 2020-04-04 ENCOUNTER — Other Ambulatory Visit: Payer: Self-pay

## 2020-04-04 ENCOUNTER — Ambulatory Visit: Payer: BC Managed Care – PPO | Admitting: Family Medicine

## 2020-04-04 DIAGNOSIS — F909 Attention-deficit hyperactivity disorder, unspecified type: Secondary | ICD-10-CM | POA: Diagnosis not present

## 2020-04-04 DIAGNOSIS — K219 Gastro-esophageal reflux disease without esophagitis: Secondary | ICD-10-CM | POA: Diagnosis not present

## 2020-04-04 DIAGNOSIS — H18459 Nodular corneal degeneration, unspecified eye: Secondary | ICD-10-CM | POA: Diagnosis not present

## 2020-04-04 MED ORDER — RESTASIS 0.05 % OP EMUL: 1.0000 [drp] | Freq: Two times a day (BID) | OPHTHALMIC | 0 refills | Status: DC

## 2020-04-04 MED ORDER — AMPHETAMINE-DEXTROAMPHETAMINE 12.5 MG PO TABS: 12.5000 mg | ORAL_TABLET | Freq: Two times a day (BID) | ORAL | 0 refills | Status: DC

## 2020-04-04 NOTE — Progress Notes (Signed)
Tommi Rumps, MD Phone: [phone]  Suzanne Cruz is a 54 y.o. female who presents today for f/u  ADHD Medication: taking adderall mostly once daily Effectiveness: yes Palpitations: rare fluttering only when she takes the flexeril Sleep difficulty: only if she takes her second dose of the day, occasionally wakes up at night Appetite suppression: no  GERD:   Reflux symptoms: some   Abd pain: some if takes NSAID or depending on what she eats   Blood in stool: no  Dysphagia: rare   EGD: 2019 with schatzki ring and gastropathy  Medication: omeprazole, occasional carafate She saw GI recently for this and they recommended continued omeprazole and Carafate use.  Given dysphagia precautions.  Patient wonders if she could have a hernia as the shape of her abdomen appears to have changed over the years.  Dry eyes/Salzman nodular degeneration: Uses Restasis.  Has seen ophthalmology for this in the past.   Social History   Tobacco Use  Smoking Status Never Smoker  Smokeless Tobacco Never Used     ROS see history of present illness  Objective  Physical Exam Vitals:   04/04/20 1424  BP: 128/66  Pulse: (!) 102  Temp: 98.1 F (36.7 C)  SpO2: 98%    BP Readings from Last 3 Encounters:  04/04/20 128/66  01/04/20 118/80  12/28/19 104/70   Wt Readings from Last 3 Encounters:  04/04/20 132 lb 3.2 oz (60 kg)  01/04/20 131 lb 6.4 oz (59.6 kg)  12/28/19 129 lb 6.4 oz (58.7 kg)    Physical Exam Constitutional:      General: She is not in acute distress.    Appearance: She is not diaphoretic.  Cardiovascular:     Rate and Rhythm: Normal rate and regular rhythm.     Heart sounds: Normal heart sounds.  Pulmonary:     Effort: Pulmonary effort is normal.     Breath sounds: Normal breath sounds.  Abdominal:     General: Bowel sounds are normal. There is no distension.     Palpations: Abdomen is soft.     Tenderness: There is no abdominal tenderness. There is no guarding  or rebound.  Skin:    General: Skin is warm and dry.  Neurological:     Mental Status: She is alert.      Assessment/Plan: Please see individual problem list.  GERD (gastroesophageal reflux disease) Encouraged continued omeprazole.  Discussed avoiding NSAIDs.  She can continue Carafate as needed.  Discussed there is no sign of hernia on exam.  Adult ADHD Relatively stable.  Discussed option of continuing with taking her Adderall mostly once daily to limit sleep issues or trying an alternative regimen of Adderall XR or decreased dose.  She opted to continue with her current regimen.  Discussed monitoring for palpitations and trying to avoid the Flexeril if those are contributing.  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  I will refill her Restasis on one occasion.   Health Maintenance: Patient sees GYN for Pap smears and mammogram.  I had a long discussion with her regarding the COVID-19 vaccines and encouraged her to get one of the vaccines.  Discussed safety and efficacy as well as benefit of the vaccines.  Discussed the benefit outweighs the risk for her.  No orders of the defined types were placed in this encounter.   Meds ordered this encounter  Medications  . cycloSPORINE (RESTASIS) 0.05 % ophthalmic emulsion    Sig: Place 1  drop into both eyes 2 (two) times daily.    Dispense:  1.5 mL    Refill:  0  . amphetamine-dextroamphetamine (ADDERALL) 12.5 MG tablet    Sig: Take 1 tablet by mouth 2 (two) times daily.    Dispense:  60 tablet    Refill:  0    This visit occurred during the SARS-CoV-2 public health emergency.  Safety protocols were in place, including screening questions prior to the visit, additional usage of staff PPE, and extensive cleaning of exam room while observing appropriate contact time as indicated for disinfecting solutions.    Tommi Rumps, MD [place]

## 2020-04-04 NOTE — Patient Instructions (Signed)
Nice to see you. I would like for you to follow-up with your eye doctor for your eye issues.  I have refilled your Restasis this 1 time. Please continue with your omeprazole and Carafate. I have refilled your Adderall.

## 2020-04-04 NOTE — Assessment & Plan Note (Signed)
Relatively stable.  Discussed option of continuing with taking her Adderall mostly once daily to limit sleep issues or trying an alternative regimen of Adderall XR or decreased dose.  She opted to continue with her current regimen.  Discussed monitoring for palpitations and trying to avoid the Flexeril if those are contributing.

## 2020-04-04 NOTE — Assessment & Plan Note (Signed)
Encouraged continued omeprazole.  Discussed avoiding NSAIDs.  She can continue Carafate as needed.  Discussed there is no sign of hernia on exam.

## 2020-04-04 NOTE — Assessment & Plan Note (Signed)
Discussed that she needs to continue to see her eye doctor.  I will refill her Restasis on one occasion.

## 2020-04-18 ENCOUNTER — Other Ambulatory Visit: Payer: Self-pay

## 2020-04-18 ENCOUNTER — Encounter: Payer: Self-pay | Admitting: Podiatry

## 2020-04-18 ENCOUNTER — Ambulatory Visit: Payer: BC Managed Care – PPO | Admitting: Podiatry

## 2020-04-18 DIAGNOSIS — M21611 Bunion of right foot: Secondary | ICD-10-CM

## 2020-04-18 DIAGNOSIS — M19072 Primary osteoarthritis, left ankle and foot: Secondary | ICD-10-CM | POA: Diagnosis not present

## 2020-04-18 DIAGNOSIS — M79672 Pain in left foot: Secondary | ICD-10-CM | POA: Diagnosis not present

## 2020-04-18 DIAGNOSIS — M21612 Bunion of left foot: Secondary | ICD-10-CM | POA: Diagnosis not present

## 2020-04-18 NOTE — Progress Notes (Signed)
HPI: 54 year old female presents the office today for follow-up evaluation regarding bunions to the bilateral feet.  She does have a history of a bunionectomy to her left foot back in 2006 in [place].  Patient had a poor result with shortening and recurrence of the bunion deformity.  She continues to have pain associated with this bunion.  Patient is scheduled for surgery on 05/29/2020 however she is in a position where she is working a very demanding job and she is unable to take time off work postoperatively.  She has no new complaints at this time  Past Medical History:  Diagnosis Date  . Chicken pox   . Colon polyps 2015  . Cyst of left kidney   . Depression   . Dysphagia   . Endometriosis   . GERD (gastroesophageal reflux disease)   . History of gastritis 09/17/2015  . History of kidney stones   . History of stomach ulcers   . IBS (irritable bowel syndrome)   . Migraines      Physical Exam: General: The patient is alert and oriented x3 in no acute distress.  Dermatology: Skin is warm, dry and supple bilateral lower extremities. Negative for open lesions or macerations.  Vascular: Palpable pedal pulses bilaterally. No edema or erythema noted. Capillary refill within normal limits.  Neurological: Epicritic and protective threshold grossly intact bilaterally.   Musculoskeletal Exam: Range of motion within normal limits to all pedal and ankle joints bilateral. Muscle strength 5/5 in all groups bilateral.  Large medial eminence of the first metatarsal heads noted to the bilateral feet.  Radiographic Exam taken 07/21/2018:  Normal osseous mineralization. Joint spaces preserved. No fracture/dislocation/boney destruction.  In regards to the history of the bunionectomy to the left foot there is no orthopedic hardware noted.  There continues to be a significant increased intermetatarsal angle noted to the first intermetatarsal space greater than 15 degrees with abnormal alignment of the  proximal articular set angle of the metatarsal head.  There is also some abnormal appearance of the diaphysis of the proximal phalanx to the left hallux possibly consistent with [facility] osteotomy without any internal fixation.  Assessment: 1.  Hallux abductovalgus deformity bilateral 2.  History of bunionectomy left-2006   Plan of Care:  1. Patient evaluated. X-Rays reviewed.  2.  Left foot bunion: Patient would continue to benefit most greatly from first MTPJ arthrodesis with bone allograft to restore the length of the first ray.  I explained in detail this procedure and she is in agreement that she would like to have surgery. 3.  Right foot bunion: Bunionectomy procedure to the right foot would consist of bunionectomy with first metatarsal osteotomy and possible Akin osteotomy.  I explained to the patient that the right foot bunionectomy procedure should have much greater success and more reproducible results given the fact that it is not a revisional surgery.  Patient understands.  4.  The patient would like to proceed with left foot surgery first, however she is not in a position to take time off of work currently..  All possible complications and details the procedure were explained.  No guarantees were expressed or implied.  Specifically the postoperative recovery period was explained.  She knows that she will be nonweightbearing for a minimum of 6 weeks.  Recommended walker and knee scooter  5.  Recommend that the patient contact the surgery scheduler when there is a time that she can reduce her schedule and recover appropriately postoperatively  *Husband owns [employer]  [facility].  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 Edrick Kins, DPM    [address], [place] [address]                Office [phone]  Fax [phone]

## 2020-04-24 ENCOUNTER — Telehealth: Payer: Self-pay | Admitting: *Deleted

## 2020-04-24 NOTE — Telephone Encounter (Signed)
"  I'm scheduled for surgery on May 29, 2020.  I need to cancel it for now.  I need to work.  I'll call back later to reschedule it."  I'll let Suzanne Cruz know to cancel it.

## 2020-04-24 NOTE — Telephone Encounter (Signed)
I have cancelled pt's sx on Dr. Amalia Hailey schedule in both the sx book and in [place].

## 2020-05-03 ENCOUNTER — Other Ambulatory Visit: Payer: Self-pay | Admitting: Family Medicine

## 2020-05-08 DIAGNOSIS — I83813 Varicose veins of bilateral lower extremities with pain: Secondary | ICD-10-CM | POA: Diagnosis not present

## 2020-05-08 DIAGNOSIS — I83893 Varicose veins of bilateral lower extremities with other complications: Secondary | ICD-10-CM | POA: Diagnosis not present

## 2020-05-08 DIAGNOSIS — M222X2 Patellofemoral disorders, left knee: Secondary | ICD-10-CM | POA: Diagnosis not present

## 2020-05-08 DIAGNOSIS — I781 Nevus, non-neoplastic: Secondary | ICD-10-CM | POA: Diagnosis not present

## 2020-06-06 ENCOUNTER — Ambulatory Visit: Payer: BC Managed Care – PPO | Admitting: Nurse Practitioner

## 2020-06-06 ENCOUNTER — Encounter: Payer: Self-pay | Admitting: Nurse Practitioner

## 2020-06-06 ENCOUNTER — Ambulatory Visit (INDEPENDENT_AMBULATORY_CARE_PROVIDER_SITE_OTHER): Payer: BC Managed Care – PPO

## 2020-06-06 ENCOUNTER — Other Ambulatory Visit: Payer: Self-pay | Admitting: Family Medicine

## 2020-06-06 ENCOUNTER — Other Ambulatory Visit: Payer: Self-pay

## 2020-06-06 VITALS — BP 102/72 | HR 98 | Temp 98.2°F | Ht 66.0 in | Wt 133.0 lb

## 2020-06-06 DIAGNOSIS — M546 Pain in thoracic spine: Secondary | ICD-10-CM | POA: Diagnosis not present

## 2020-06-06 NOTE — Progress Notes (Signed)
Established Patient Office Visit  Subjective:  Patient ID: Suzanne Cruz, female    DOB: May 08, 1966  Age: 54 y.o. MRN: [id number]  CC:  Chief Complaint  Patient presents with  . Acute Visit    back and lower chest pain    HPI Suzanne Cruz is 54 year old with history of cervicalgia, lumbago with sciatica, reports bilat bra line pain with radiation up into left scapular area - onset 3-4 weeks ago described as constant- but gets worse when she lays down at night. She takes Flexeril and relieves it. Sometimes, she can wake up in the morning and it is gone.  Other times,  it is present all day and is  worse by the end of the day.  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  She does not try Tylenol.  No anterior chest pain, SOB, DOE, cough, fever/chills. No injury or trauma. No GI complaints. She cut her PPI dose to 20 mg omeprazole QD, Carafate only as needed when her stomach is bothering her- not taken in a while.   One month ago, she had a cold with sinus drainage and cough, no fever, no change in taste/smell  and sx lasted 2 weeks and resolved with Zycam and Vit C. No Covid exposure or test. Fully Covid vaccinated.   Past Medical History:  Diagnosis Date  . Chicken pox   . Colon polyps 2015  . Cyst of left kidney   . Depression   . Dysphagia   . Endometriosis   . GERD (gastroesophageal reflux disease)   . History of gastritis 09/17/2015  . History of kidney stones   . History of stomach ulcers   . IBS (irritable bowel syndrome)   . Migraines     Past Surgical History:  Procedure Laterality Date  . ABDOMINAL HYSTERECTOMY  2001   partial only right ovary is the only thing she as.  Marland Kitchen BREAST SURGERY Left 2005   biopsy  . BUNIONECTOMY Left 2004  . COLONOSCOPY WITH PROPOFOL N/A 03/17/2018   Procedure: COLONOSCOPY WITH PROPOFOL;  Surgeon: [name], MD;  Location: [facility] ENDOSCOPY;  Service: Endoscopy;  Laterality: N/A;  . ESOPHAGOGASTRODUODENOSCOPY (EGD) WITH  PROPOFOL N/A 02/10/2015   Procedure: ESOPHAGOGASTRODUODENOSCOPY (EGD) WITH PROPOFOL;  Surgeon: [name], MD;  Location: [facility] ENDOSCOPY;  Service: Endoscopy;  Laterality: N/A;  . ESOPHAGOGASTRODUODENOSCOPY (EGD) WITH PROPOFOL N/A 03/17/2018   Procedure: ESOPHAGOGASTRODUODENOSCOPY (EGD) WITH PROPOFOL;  Surgeon: [name], MD;  Location: [facility] ENDOSCOPY;  Service: Endoscopy;  Laterality: N/A;  . LITHOTRIPSY  1993    Family History  Problem Relation Age of Onset  . Sudden death Brother   . Hypertension Mother   . Colon polyps Mother   . Hypertension Brother   . Stroke Paternal Grandmother     Social History   Socioeconomic History  . Marital status: Married    Spouse name: Not on file  . Number of children: Not on file  . Years of education: Not on file  . Highest education level: Not on file  Occupational History  . Not on file  Tobacco Use  . Smoking status: Never Smoker  . Smokeless tobacco: Never Used  Vaping Use  . Vaping Use: Never used  Substance and Sexual Activity  . Alcohol use: Yes    Alcohol/week: 0.0 - 1.0 standard drinks  . Drug use: No  . Sexual activity: Yes    Birth control/protection: Post-menopausal  Other Topics Concern  . Not on  file  Social History Narrative  . Not on file   Social Determinants of Health   Financial Resource Strain:   . Difficulty of Paying Living Expenses: Not on file  Food Insecurity:   . Worried About Charity fundraiser in the Last Year: Not on file  . Ran Out of Food in the Last Year: Not on file  Transportation Needs:   . Lack of Transportation (Medical): Not on file  . Lack of Transportation (Non-Medical): Not on file  Physical Activity:   . Days of Exercise per Week: Not on file  . Minutes of Exercise per Session: Not on file  Stress:   . Feeling of Stress : Not on file  Social Connections:   . Frequency of Communication with Friends and Family: Not on file  . Frequency of Social Gatherings with Friends  and Family: Not on file  . Attends Religious Services: Not on file  . Active Member of Clubs or Organizations: Not on file  . Attends Archivist Meetings: Not on file  . Marital Status: Not on file  Intimate Partner Violence:   . Fear of Current or Ex-Partner: Not on file  . Emotionally Abused: Not on file  . Physically Abused: Not on file  . Sexually Abused: Not on file    Outpatient Medications Prior to Visit  Medication Sig Dispense Refill  . amphetamine-dextroamphetamine (ADDERALL) 12.5 MG tablet Take 1 tablet by mouth 2 (two) times daily. 60 tablet 0  . cyclobenzaprine (FLEXERIL) 5 MG tablet Take 1 tablet (5 mg total) by mouth at bedtime as needed for muscle spasms. 15 tablet 0  . diclofenac (VOLTAREN) 75 MG EC tablet diclofenac sodium 75 mg tablet,delayed release  TAKE 1 TABLET BY MOUTH TWICE A DAY    . FLUoxetine (PROZAC) 20 MG tablet Take 1 tablet (20 mg total) by mouth daily. 30 tablet 3  . omeprazole (PRILOSEC) 40 MG capsule Take 1 capsule (40 mg total) by mouth daily. 90 capsule 1  . RESTASIS 0.05 % ophthalmic emulsion INSTILL 1 DROP INTO BOTH EYES TWICE A DAY 60 mL 0  . sucralfate (CARAFATE) 1 GM/10ML suspension Take 10 mLs (1 g total) by mouth 4 (four) times daily -  with meals and at bedtime. 420 mL 0   Facility-Administered Medications Prior to Visit  Medication Dose Route Frequency Provider Last Rate Last Admin  . lidocaine (XYLOCAINE) 2 % jelly 1 application  1 application Urethral Once Stoioff, Ronda Fairly, MD        Allergies  Allergen Reactions  . Sulfa Antibiotics Rash and Hives    Review of Systems Pertinent positives noted in history of present illness otherwise negative   Objective:    Physical Exam Vitals reviewed.  Constitutional:      Appearance: She is normal weight.  HENT:     Head: Normocephalic and atraumatic.  Cardiovascular:     Rate and Rhythm: Normal rate and regular rhythm.     Pulses: Normal pulses.     Heart sounds: Normal  heart sounds.  Pulmonary:     Effort: Pulmonary effort is normal.     Breath sounds: Normal breath sounds. No wheezing or rales.  Abdominal:     Palpations: Abdomen is soft.     Tenderness: There is no abdominal tenderness.  Musculoskeletal:        General: Normal range of motion.     Cervical back: Normal range of motion and neck supple.     Comments:  Left posterior upper back- scapular area- tender and with movement. No rash or bony deformity Normal ROM.  C spine and thoracic spine non tender.   Skin:    General: Skin is warm and dry.     Findings: No rash.  Neurological:     General: No focal deficit present.     Mental Status: She is alert and oriented to person, place, and time.  Psychiatric:        Mood and Affect: Mood normal.        Behavior: Behavior normal.     BP 102/72 (BP Location: Left Arm, Patient Position: Sitting, Cuff Size: Normal)   Pulse 98   Temp 98.2 F (36.8 C) (Oral)   Ht 5\' 6"  (1.676 m)   Wt 133 lb (60.3 kg)   SpO2 99%   BMI 21.47 kg/m  Wt Readings from Last 3 Encounters:  06/06/20 133 lb (60.3 kg)  04/04/20 132 lb 3.2 oz (60 kg)  01/04/20 131 lb 6.4 oz (59.6 kg)   Pulse Readings from Last 3 Encounters:  06/06/20 98  04/04/20 (!) 102  01/04/20 82    BP Readings from Last 3 Encounters:  06/06/20 102/72  04/04/20 128/66  01/04/20 118/80    Lab Results  Component Value Date   CHOL 227 (H) 04/24/2019   HDL 69.40 04/24/2019   LDLCALC 135 (H) 04/24/2019   LDLDIRECT 125.0 09/17/2015   TRIG 114.0 04/24/2019   CHOLHDL 3 04/24/2019      Health Maintenance Due  Topic Date Due  . Hepatitis C Screening  Never done  . HIV Screening  Never done  . PAP SMEAR-Modifier  09/28/2016  . MAMMOGRAM  Never done  . INFLUENZA VACCINE  03/30/2020    There are no preventive care reminders to display for this patient.  Lab Results  Component Value Date   TSH 0.78 05/16/2018   Lab Results  Component Value Date   WBC 4.4 12/28/2019   HGB 13.6  12/28/2019   HCT 40.5 12/28/2019   MCV 86.5 12/28/2019   PLT 211 12/28/2019   Lab Results  Component Value Date   NA 142 12/28/2019   K 4.8 12/28/2019   CO2 27 12/28/2019   GLUCOSE 93 12/28/2019   BUN 20 12/28/2019   CREATININE 0.98 12/28/2019   BILITOT 0.4 12/28/2019   ALKPHOS 82 04/24/2019   AST 16 12/28/2019   ALT 15 12/28/2019   PROT 6.7 12/28/2019   ALBUMIN 4.7 04/24/2019   CALCIUM 9.6 12/28/2019   ANIONGAP 6 09/15/2018   GFR 64.38 04/24/2019   Lab Results  Component Value Date   CHOL 227 (H) 04/24/2019   Lab Results  Component Value Date   HDL 69.40 04/24/2019   Lab Results  Component Value Date   LDLCALC 135 (H) 04/24/2019   Lab Results  Component Value Date   TRIG 114.0 04/24/2019   Lab Results  Component Value Date   CHOLHDL 3 04/24/2019   Lab Results  Component Value Date   HGBA1C 5.8 04/24/2019      Assessment & Plan:   Problem List Items Addressed This Visit    None    Visit Diagnoses    Acute left-sided thoracic back pain    -  Primary   Relevant Orders   DG Thoracic Spine 2 View (Completed)      No orders of the defined types were placed in this encounter.  Suspect musculoskeletal origin for your back pain because it is tender with  palpation and it hurts when you twist from side to side.  You have had no specific injury or trauma.  This may be muscle spasm, arthritis  and we will check an Xray. Please continue with your Flexeril as directed.  You may try using salon pass which is a pain patch you can purchase over-the-counter.  You can apply that as directed to your sore spot.  If you develop stomachache from your occasional ibuprofen use, resume taking your Carafate as directed.  Continue taking your omeprazole as directed.  For stress or anxiety, I do recommend referral to behavioral health for you to consider.  Please follow-up with Dr. Caryl Bis in 1 to 2 weeks.  Call back if symptoms worsen in the meantime. Follow-up: Return in  about 2 weeks (around 06/20/2020).   This visit occurred during the SARS-CoV-2 public health emergency.  Safety protocols were in place, including screening questions prior to the visit, additional usage of staff PPE, and extensive cleaning of exam room while observing appropriate contact time as indicated for disinfecting solutions.   Denice Paradise, NP

## 2020-06-06 NOTE — Patient Instructions (Addendum)
Suspect musculoskeletal origin for your back pain because it is tender with palpation and it hurts when you twist from side to side.  You have had no specific injury or trauma.  This may be muscle spasm, arthritis  and we will check an Xray. Please continue with your Flexeril as directed.  You may try using salon pass which is a pain patch you can purchase over-the-counter.  You can apply that as directed to your sore spot.  If you develop stomachache from your occasional ibuprofen use, resume taking your Carafate as directed.  Continue taking your omeprazole as directed.  For stress or anxiety, I do recommend referral to behavioral health for you to consider.  Please follow-up with Dr. Caryl Bis in 1 to 2 weeks.  Call back if symptoms worsen in the meantime.   Acute Back Pain, Adult Acute back pain is sudden and usually short-lived. It is often caused by an injury to the muscles and tissues in the back. The injury may result from:  A muscle or ligament getting overstretched or torn (strained). Ligaments are tissues that connect bones to each other. Lifting something improperly can cause a back strain.  Wear and tear (degeneration) of the spinal disks. Spinal disks are circular tissue that provides cushioning between the bones of the spine (vertebrae).  Twisting motions, such as while playing sports or doing yard work.  A hit to the back.  Arthritis. You may have a physical exam, lab tests, and imaging tests to find the cause of your pain. Acute back pain usually goes away with rest and home care. Follow these instructions at home: Managing pain, stiffness, and swelling  Take over-the-counter and prescription medicines only as told by your health care provider.  Your health care provider may recommend applying ice during the first 24-48 hours after your pain starts. To do this: ? Put ice in a plastic bag. ? Place a towel between your skin and the bag. ? Leave the ice on for 20 minutes,  2-3 times a day.  If directed, apply heat to the affected area as often as told by your health care provider. Use the heat source that your health care provider recommends, such as a moist heat pack or a heating pad. ? Place a towel between your skin and the heat source. ? Leave the heat on for 20-30 minutes. ? Remove the heat if your skin turns bright red. This is especially important if you are unable to feel pain, heat, or cold. You have a greater risk of getting burned. Activity   Do not stay in bed. Staying in bed for more than 1-2 days can delay your recovery.  Sit up and stand up straight. Avoid leaning forward when you sit, or hunching over when you stand. ? If you work at a desk, sit close to it so you do not need to lean over. Keep your chin tucked in. Keep your neck drawn back, and keep your elbows bent at a right angle. Your arms should look like the letter "L." ? Sit high and close to the steering wheel when you drive. Add lower back (lumbar) support to your car seat, if needed.  Take short walks on even surfaces as soon as you are able. Try to increase the length of time you walk each day.  Do not sit, drive, or stand in one place for more than 30 minutes at a time. Sitting or standing for long periods of time can put stress on your  back.  Do not drive or use heavy machinery while taking prescription pain medicine.  Use proper lifting techniques. When you bend and lift, use positions that put less stress on your back: ? [place] your knees. ? Keep the load close to your body. ? Avoid twisting.  Exercise regularly as told by your health care provider. Exercising helps your back heal faster and helps prevent back injuries by keeping muscles strong and flexible.  Work with a physical therapist to make a safe exercise program, as recommended by your health care provider. Do any exercises as told by your physical therapist. Lifestyle  Maintain a healthy weight. Extra weight puts  stress on your back and makes it difficult to have good posture.  Avoid activities or situations that make you feel anxious or stressed. Stress and anxiety increase muscle tension and can make back pain worse. Learn ways to manage anxiety and stress, such as through exercise. General instructions  Sleep on a firm mattress in a comfortable position. Try lying on your side with your knees slightly bent. If you lie on your back, put a pillow under your knees.  Follow your treatment plan as told by your health care provider. This may include: ? Cognitive or behavioral therapy. ? Acupuncture or massage therapy. ? Meditation or yoga. Contact a health care provider if:  You have pain that is not relieved with rest or medicine.  You have increasing pain going down into your legs or buttocks.  Your pain does not improve after 2 weeks.  You have pain at night.  You lose weight without trying.  You have a fever or chills. Get help right away if:  You develop new bowel or bladder control problems.  You have unusual weakness or numbness in your arms or legs.  You develop nausea or vomiting.  You develop abdominal pain.  You feel faint. Summary  Acute back pain is sudden and usually short-lived.  Use proper lifting techniques. When you bend and lift, use positions that put less stress on your back.  Take over-the-counter and prescription medicines and apply heat or ice as directed by your health care provider. This information is not intended to replace advice given to you by your health care provider. Make sure you discuss any questions you have with your health care provider. Document Revised: 12/05/2018 Document Reviewed: 03/30/2017 Elsevier Patient Education  [place].

## 2020-06-07 ENCOUNTER — Other Ambulatory Visit: Payer: Self-pay | Admitting: Family Medicine

## 2020-06-07 DIAGNOSIS — F909 Attention-deficit hyperactivity disorder, unspecified type: Secondary | ICD-10-CM

## 2020-06-09 ENCOUNTER — Encounter: Payer: Self-pay | Admitting: Family Medicine

## 2020-06-09 MED ORDER — AMPHETAMINE-DEXTROAMPHETAMINE 12.5 MG PO TABS: 12.5000 mg | ORAL_TABLET | Freq: Two times a day (BID) | ORAL | 0 refills | Status: AC

## 2020-06-23 DIAGNOSIS — Z6821 Body mass index (BMI) 21.0-21.9, adult: Secondary | ICD-10-CM | POA: Diagnosis not present

## 2020-06-23 DIAGNOSIS — R0981 Nasal congestion: Secondary | ICD-10-CM | POA: Diagnosis not present

## 2020-06-23 DIAGNOSIS — J029 Acute pharyngitis, unspecified: Secondary | ICD-10-CM | POA: Diagnosis not present

## 2020-06-23 DIAGNOSIS — J040 Acute laryngitis: Secondary | ICD-10-CM | POA: Diagnosis not present

## 2020-07-04 ENCOUNTER — Other Ambulatory Visit: Payer: Self-pay | Admitting: Family Medicine

## 2020-07-18 ENCOUNTER — Ambulatory Visit: Payer: BC Managed Care – PPO | Admitting: Family Medicine

## 2020-08-16 DIAGNOSIS — R590 Localized enlarged lymph nodes: Secondary | ICD-10-CM | POA: Diagnosis not present

## 2020-08-16 DIAGNOSIS — R1084 Generalized abdominal pain: Secondary | ICD-10-CM | POA: Diagnosis not present

## 2020-08-16 DIAGNOSIS — K5792 Diverticulitis of intestine, part unspecified, without perforation or abscess without bleeding: Secondary | ICD-10-CM | POA: Diagnosis not present

## 2020-08-16 DIAGNOSIS — Z87442 Personal history of urinary calculi: Secondary | ICD-10-CM | POA: Diagnosis not present

## 2020-08-16 DIAGNOSIS — R330 Drug induced retention of urine: Secondary | ICD-10-CM | POA: Diagnosis not present

## 2020-08-16 DIAGNOSIS — I880 Nonspecific mesenteric lymphadenitis: Secondary | ICD-10-CM | POA: Diagnosis not present

## 2020-08-16 DIAGNOSIS — M545 Low back pain, unspecified: Secondary | ICD-10-CM | POA: Diagnosis not present

## 2020-08-16 DIAGNOSIS — Z882 Allergy status to sulfonamides status: Secondary | ICD-10-CM | POA: Diagnosis not present

## 2020-08-16 DIAGNOSIS — R1031 Right lower quadrant pain: Secondary | ICD-10-CM | POA: Diagnosis not present

## 2020-08-16 DIAGNOSIS — R100 Acute abdomen: Secondary | ICD-10-CM | POA: Diagnosis not present

## 2020-08-20 DIAGNOSIS — N8111 Cystocele, midline: Secondary | ICD-10-CM | POA: Diagnosis not present

## 2020-08-20 DIAGNOSIS — L280 Lichen simplex chronicus: Secondary | ICD-10-CM | POA: Diagnosis not present

## 2020-08-20 DIAGNOSIS — R110 Nausea: Secondary | ICD-10-CM | POA: Diagnosis not present

## 2020-08-20 DIAGNOSIS — R102 Pelvic and perineal pain: Secondary | ICD-10-CM | POA: Diagnosis not present

## 2020-08-20 DIAGNOSIS — N904 Leukoplakia of vulva: Secondary | ICD-10-CM | POA: Diagnosis not present

## 2020-08-20 DIAGNOSIS — R109 Unspecified abdominal pain: Secondary | ICD-10-CM | POA: Diagnosis not present
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
## 2021-04-13 IMAGING — RF DG UGI W/ HIGH DENSITY W/O KUB
13 series · 13 of 13 positions shown · non-contrast
Comparison: CT 09/15/2018 report.

CLINICAL DATA: Difficulty swallowing.  Abdominal pain.

EXAM:
UPPER GI SERIES WITHOUT KUB
TECHNIQUE: Routine upper GI series was performed with high density and thin
barium.
FLUOROSCOPY TIME:  Fluoroscopy Time:  As per barium swallow report
Radiation Exposure Index (if provided by the fluoroscopic device):
As per barium swallow report

[Series 1: fluoro_barium 2fps_bw · 0.17mm/px · 1 of 1 slices shown (1 of 13)]
[im 1/1]
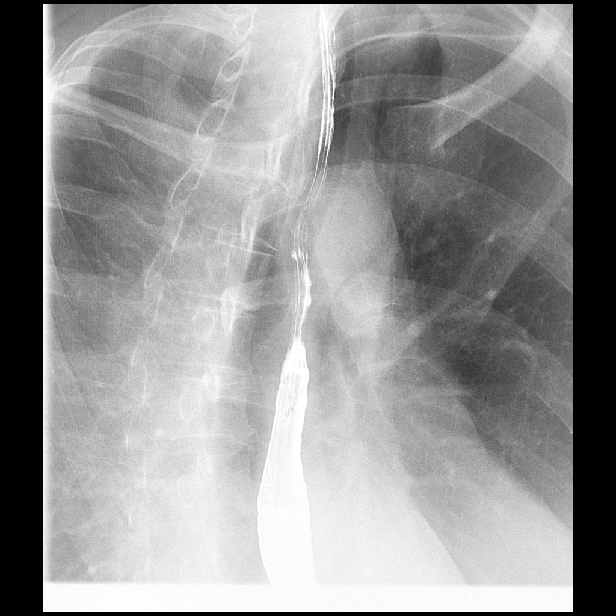

[Series 2: fluoro_barium 2fps_bw · 0.17mm/px · 1 of 1 slices shown (2 of 13)]
[im 1/1]
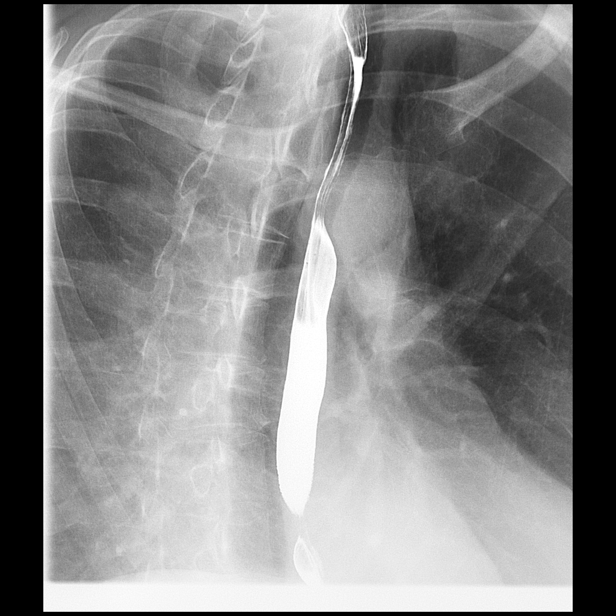

[Series 3: fluoro_barium 2fps_bw · 0.17mm/px · 1 of 1 slices shown (3 of 13)]
[im 1/1]
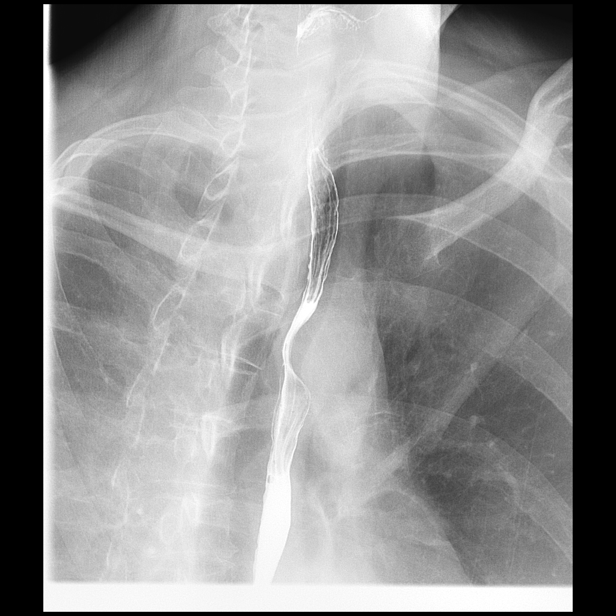

[Series 4: fluoro_barium 2fps_bw · 0.17mm/px · 1 of 1 slices shown (4 of 13)]
[im 1/1]
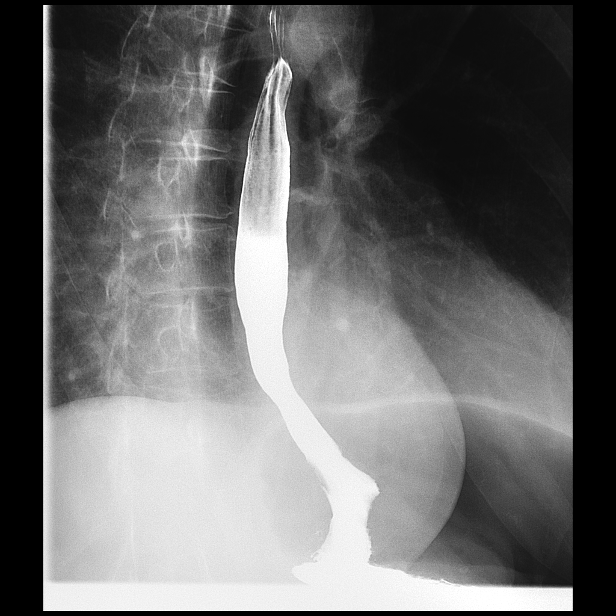

[Series 5: fluoro_barium 2fps_bw · 0.18mm/px · 1 of 1 slices shown (5 of 13)]
[im 1/1]
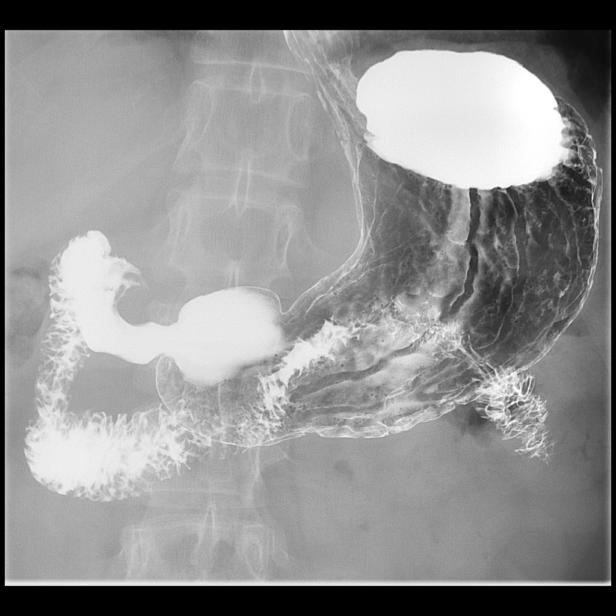

[Series 6: fluoro_barium 2fps_bw · 0.18mm/px · 1 of 1 slices shown (6 of 13)]
[im 1/1]
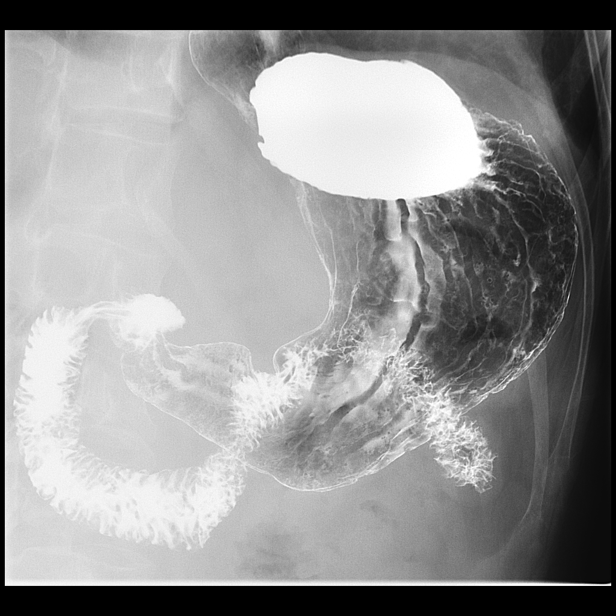

[Series 7: fluoro_barium 2fps_bw · 0.18mm/px · 1 of 1 slices shown (7 of 13)]
[im 1/1]
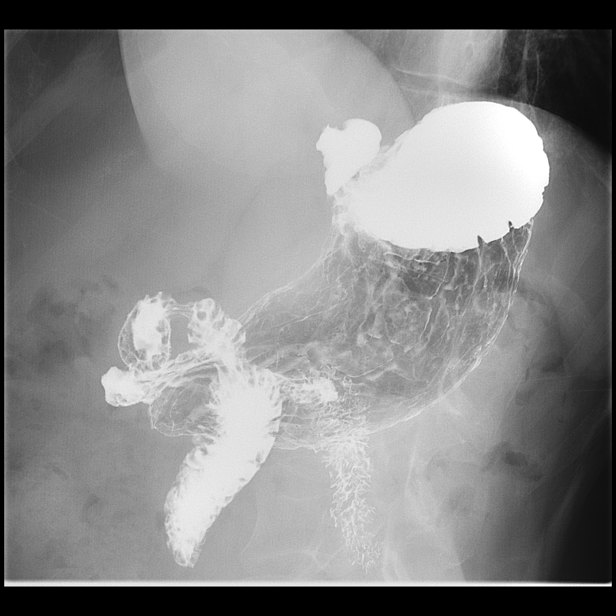

[Series 8: fluoro_barium 2fps_bw · 0.18mm/px · 1 of 1 slices shown (8 of 13)]
[im 1/1]
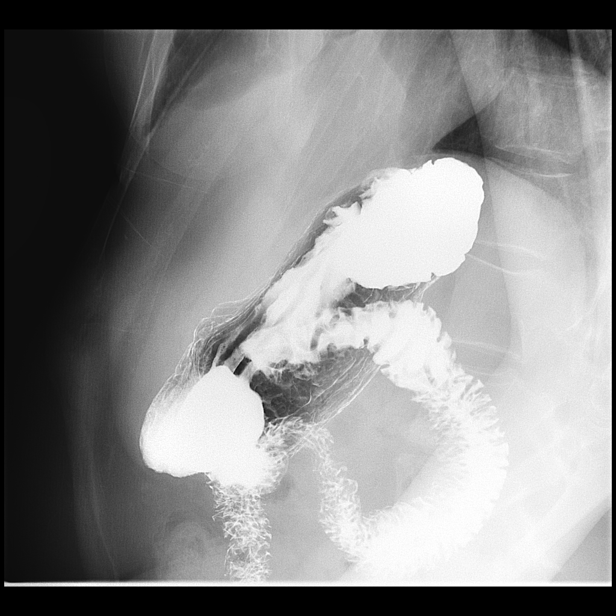

[Series 9: fluoro_barium 2fps_bw · 0.18mm/px · 1 of 1 slices shown (9 of 13)]
[im 1/1]
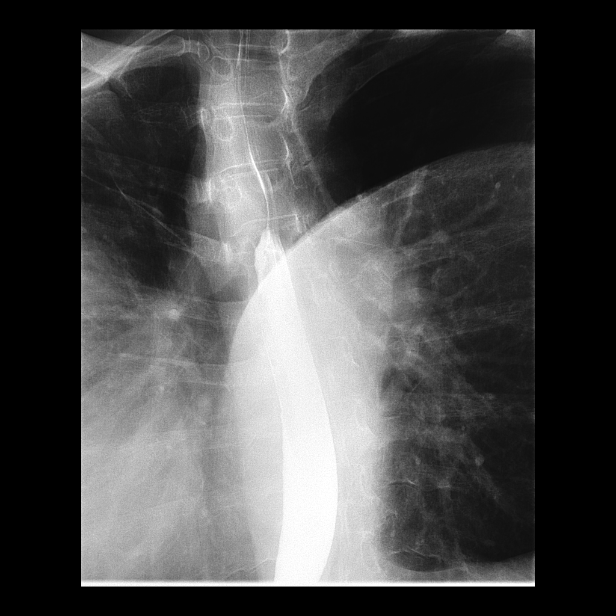

[Series 10: fluoro_barium 2fps_bw · 0.18mm/px · 1 of 1 slices shown (10 of 13)]
[im 1/1]
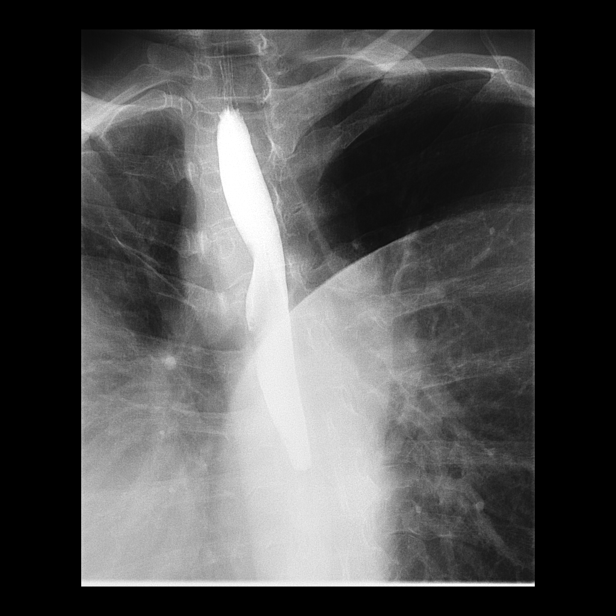

[Series 11: fluoro_barium 2fps_bw · 0.18mm/px · 1 of 1 slices shown (11 of 13)]
[im 1/1]
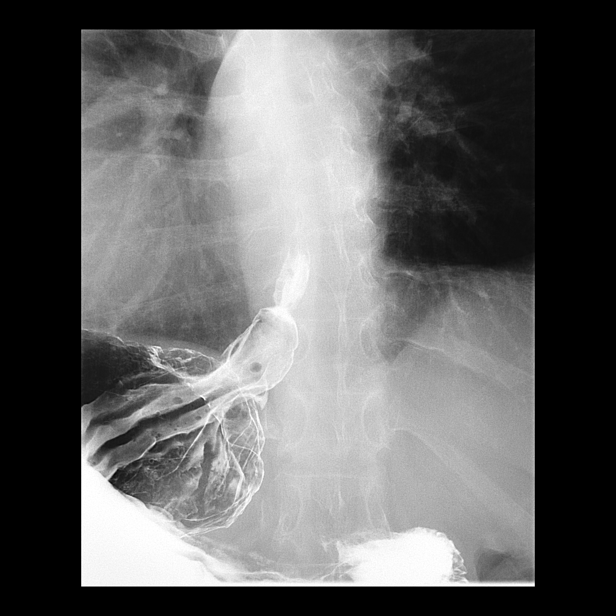

[Series 12: fluoro_barium 2fps_bw · 0.18mm/px · 1 of 1 slices shown (12 of 13)]
[im 1/1]
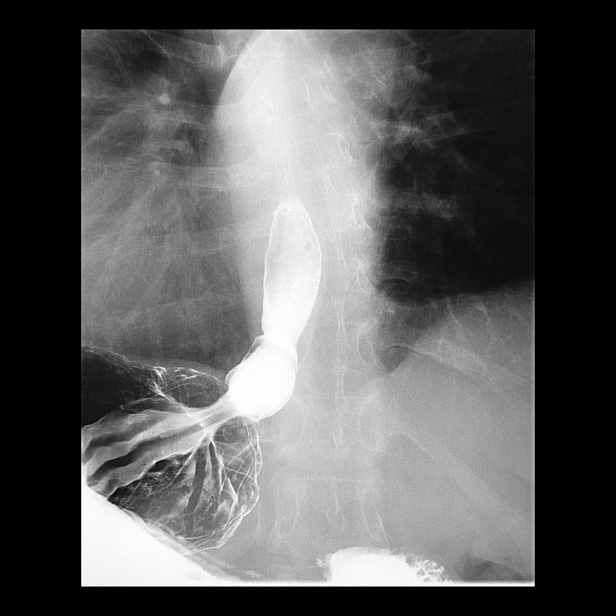

[Series 13: fluoro_barium 2fps_bw · 0.18mm/px · 1 of 1 slices shown (13 of 13)]
[im 1/1]
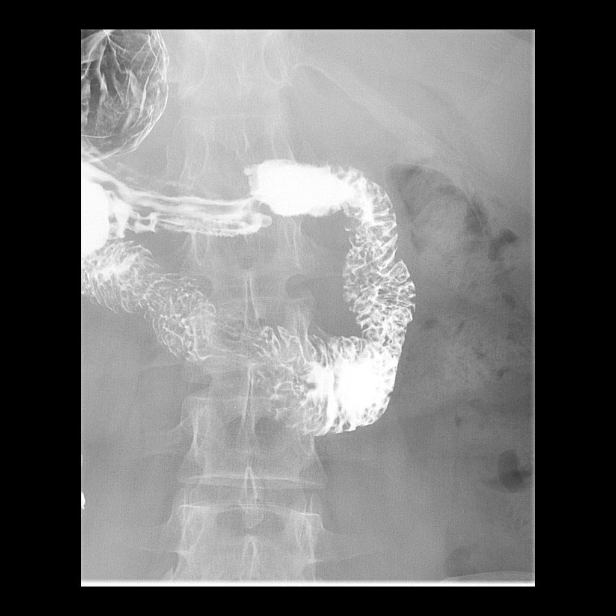

[13 of 13 positions shown; findings below may reference images not displayed]

FINDINGS: Again esophagus is unremarkable. Small hiatal hernia noted. No
reflux. Stomach appears normal. Gastric antrum distends well.
Duodenum and C-loop appear normal. No evidence of ulceration.
Visualized proximal small bowel normal.
IMPRESSION: 1.  Small sliding hiatal hernia.  No reflux.

2. Stomach and duodenum appear normal. No evidence of ulceration or
focal abnormality.

## 2024-07-17 NOTE — Telephone Encounter (Signed)
 Open in error
